# Patient Record
Sex: Female | Born: 1980 | Race: Black or African American | Hispanic: No | Marital: Married | Smoking: Never smoker
Health system: Southern US, Community
[De-identification: ages and names within clinical notes are randomized; demographics above are authoritative.]

## PROBLEM LIST (undated history)

## (undated) DIAGNOSIS — N912 Amenorrhea, unspecified: Secondary | ICD-10-CM

## (undated) DIAGNOSIS — B9689 Other specified bacterial agents as the cause of diseases classified elsewhere: Secondary | ICD-10-CM

## (undated) DIAGNOSIS — E119 Type 2 diabetes mellitus without complications: Secondary | ICD-10-CM

## (undated) DIAGNOSIS — IMO0002 Reserved for concepts with insufficient information to code with codable children: Secondary | ICD-10-CM

## (undated) DIAGNOSIS — R7309 Other abnormal glucose: Secondary | ICD-10-CM

## (undated) DIAGNOSIS — N760 Acute vaginitis: Secondary | ICD-10-CM

## (undated) DIAGNOSIS — D069 Carcinoma in situ of cervix, unspecified: Secondary | ICD-10-CM

## (undated) DIAGNOSIS — N898 Other specified noninflammatory disorders of vagina: Secondary | ICD-10-CM

## (undated) HISTORY — DX: Acute vaginitis: N76.0

## (undated) HISTORY — DX: Type 2 diabetes mellitus without complications: E11.9

## (undated) HISTORY — DX: Other abnormal glucose: R73.09

## (undated) HISTORY — DX: Reserved for concepts with insufficient information to code with codable children: IMO0002

## (undated) HISTORY — DX: Other specified noninflammatory disorders of vagina: N89.8

## (undated) HISTORY — DX: Amenorrhea, unspecified: N91.2

## (undated) HISTORY — DX: Carcinoma in situ of cervix, unspecified: D06.9

## (undated) HISTORY — DX: Other specified bacterial agents as the cause of diseases classified elsewhere: B96.89

---

## 2007-04-04 ENCOUNTER — Emergency Department (HOSPITAL_COMMUNITY): Admission: EM | Admit: 2007-04-04 | Discharge: 2007-04-04 | Payer: Self-pay | Admitting: Emergency Medicine

## 2009-07-07 ENCOUNTER — Emergency Department: Payer: Self-pay | Admitting: Emergency Medicine

## 2010-04-05 ENCOUNTER — Emergency Department: Payer: Self-pay | Admitting: Emergency Medicine

## 2012-05-31 ENCOUNTER — Encounter: Payer: Self-pay | Admitting: Obstetrics and Gynecology

## 2012-09-28 ENCOUNTER — Observation Stay: Payer: Self-pay | Admitting: Obstetrics and Gynecology

## 2012-10-09 ENCOUNTER — Inpatient Hospital Stay: Payer: Self-pay | Admitting: Obstetrics and Gynecology

## 2012-10-09 LAB — CBC WITH DIFFERENTIAL/PLATELET
Basophil %: 0.3 %
Eosinophil %: 0.3 %
HCT: 33.5 % — ABNORMAL LOW (ref 35.0–47.0)
Lymphocyte #: 1.7 10*3/uL (ref 1.0–3.6)
MCH: 24.9 pg — ABNORMAL LOW (ref 26.0–34.0)
MCHC: 31.3 g/dL — ABNORMAL LOW (ref 32.0–36.0)
MCV: 80 fL (ref 80–100)
Monocyte #: 0.8 x10 3/mm (ref 0.2–0.9)
Monocyte %: 6.2 %
Neutrophil %: 79.3 %
Platelet: 189 10*3/uL (ref 150–440)
RDW: 14.8 % — ABNORMAL HIGH (ref 11.5–14.5)
WBC: 12.3 10*3/uL — ABNORMAL HIGH (ref 3.6–11.0)

## 2012-10-10 LAB — HEMATOCRIT: HCT: 29.2 % — ABNORMAL LOW (ref 35.0–47.0)

## 2012-12-19 HISTORY — PX: CERVICAL BIOPSY  W/ LOOP ELECTRODE EXCISION: SUR135

## 2013-10-09 ENCOUNTER — Ambulatory Visit: Payer: Self-pay | Admitting: Family Medicine

## 2013-11-12 ENCOUNTER — Ambulatory Visit: Payer: Self-pay | Admitting: Family Medicine

## 2013-11-12 DIAGNOSIS — Z0289 Encounter for other administrative examinations: Secondary | ICD-10-CM

## 2014-11-04 LAB — HM PAP SMEAR: HM PAP: NEGATIVE

## 2015-04-28 NOTE — H&P (Signed)
L&D Evaluation:  History:   HPI 31 yof G3P2002 aqt 39.[redacted] weeks gestation.    Presents with contractions    Patient's Medical History GBS+; ASB; L.Uteri    Patient's Surgical History none    Medications Pre Natal Vitamins    Allergies NKDA    Social History none    Family History Non-Contributory   ROS:   ROS All systems were reviewed.  HEENT, CNS, GI, GU, Respiratory, CV, Renal and Musculoskeletal systems were found to be normal.   Exam:   Vital Signs stable    Urine Protein negative dipstick    General no apparent distress    Heart normal sinus rhythm    Abdomen gravid, non-tender    Estimated Fetal Weight Average for gestational age, 8#    Edema no edema    Pelvic no external lesions, 5/80/-2/VTX/BOWI    Mebranes Intact    FHT normal rate with no decels    Ucx regular    Skin dry, no lesions, no rashes    Lymph no lymphadenopathy    Other A+/Atb-/NR/RI/VI/HB-/HIV-/Glucola 151/3hrGTT WNL/ GBS+   Impression:   Impression active labor, GBS+   Plan:   Plan antibiotics for GBBS prophylaxis, SVD   Electronic Signatures: Erika Salazar, Prentice DockerMartin A (MD)  (Signed 22-Oct-13 19:30)  Authored: L&D Evaluation   Last Updated: 22-Oct-13 19:30 by Iridiana Fonner, Prentice DockerMartin A (MD)

## 2015-06-18 ENCOUNTER — Encounter: Payer: Self-pay | Admitting: Obstetrics and Gynecology

## 2015-11-05 DIAGNOSIS — R7303 Prediabetes: Secondary | ICD-10-CM | POA: Insufficient documentation

## 2015-12-22 ENCOUNTER — Encounter: Payer: Self-pay | Admitting: Obstetrics and Gynecology

## 2016-03-24 ENCOUNTER — Ambulatory Visit (INDEPENDENT_AMBULATORY_CARE_PROVIDER_SITE_OTHER): Payer: Managed Care, Other (non HMO) | Admitting: Obstetrics and Gynecology

## 2016-03-24 ENCOUNTER — Encounter: Payer: Self-pay | Admitting: Obstetrics and Gynecology

## 2016-03-24 VITALS — BP 110/73 | HR 62 | Wt 153.1 lb

## 2016-03-24 DIAGNOSIS — Z87410 Personal history of cervical dysplasia: Secondary | ICD-10-CM | POA: Diagnosis not present

## 2016-03-24 DIAGNOSIS — Z8742 Personal history of other diseases of the female genital tract: Secondary | ICD-10-CM

## 2016-03-24 DIAGNOSIS — Z3041 Encounter for surveillance of contraceptive pills: Secondary | ICD-10-CM | POA: Diagnosis not present

## 2016-03-24 DIAGNOSIS — Z0000 Encounter for general adult medical examination without abnormal findings: Secondary | ICD-10-CM | POA: Diagnosis not present

## 2016-03-24 DIAGNOSIS — Z01419 Encounter for gynecological examination (general) (routine) without abnormal findings: Secondary | ICD-10-CM

## 2016-03-24 MED ORDER — NORETHIN ACE-ETH ESTRAD-FE 1-20 MG-MCG(24) PO TABS: 1.0000 | ORAL_TABLET | Freq: Every day | ORAL | Status: DC

## 2016-03-24 NOTE — Patient Instructions (Signed)
1. Pap smear is done today. 2. Continue with self breast exam monthly basis 3. Continue with healthy eating and exercise. Exercise 30 minutes a day 5 days a week. 4. Minimize alcohol 1 drink a day or less; avoid tobacco products 5. Return in 1 year for annual exam 6. Birth control pills are refilled for 1 year.

## 2016-03-24 NOTE — Progress Notes (Signed)
ANNUAL PREVENTATIVE CARE GYN  ENCOUNTER NOTE  Subjective:       Erika Salazar is a 35 y.o. No obstetric history on file. female here for a routine annual gynecologic exam.  Current complaints: 1.  Several episodes of pelvic pressure while running feeling like "uterus was falling out"  On June L1/20 oral contraceptives with regular cycles. Bowel and bladder function are normal. While returning to regular activity, patient noted they pelvic pressure symptoms temporarily; symptoms have since resolved.  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 Contraception: OCP (estrogen/progesterone) History of cervical dysplasia, status post LEEP cone biopsy  Obstetric History OB History  No data available    Past Medical History  Diagnosis Date  . Severe dysplasia of cervix   . BV (bacterial vaginosis)   . ASCUS with positive high risk HPV   . Amenorrhea   . Vaginal cyst   . Elevated glucose     Past Surgical History  Procedure Laterality Date  . Cervical biopsy  w/ loop electrode excision  2014    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- POST LEEP ECC- 05/2014 NEG    Current Outpatient Prescriptions on File Prior to Visit  Medication Sig Dispense Refill  . Norethindrone Acet-Ethinyl Est (JUNEL 1/20 PO) Take by mouth.     No current facility-administered medications on file prior to visit.    No Known Allergies  Social History   Social History  . Marital Status: Married    Spouse Name: N/A  . Number of Children: N/A  . Years of Education: N/A   Occupational History  . Not on file.   Social History Main Topics  . Smoking status: Never Smoker   . Smokeless tobacco: Not on file  . Alcohol Use: No  . Drug Use: No  . Sexual Activity: Yes    Birth Control/ Protection: Pill   Other Topics Concern  . Not on file   Social History Narrative    Family  History  Problem Relation Age of Onset  . Diabetes Father   . Cancer Neg Hx   . Heart disease Neg Hx     The following portions of the patient's history were reviewed and updated as appropriate: allergies, current medications, past family history, past medical history, past social history, past surgical history and problem list.  Review of Systems ROS Review of Systems - General ROS: negative for - chills, fatigue, fever, hot flashes, night sweats, weight gain or weight loss Psychological ROS: negative for - anxiety, decreased libido, depression, mood swings, physical abuse or sexual abuse Ophthalmic ROS: negative for - blurry vision, eye pain or loss of vision ENT ROS: negative for - headaches, hearing change, visual changes or vocal changes Allergy and Immunology ROS: negative for - hives, itchy/watery eyes or seasonal allergies Hematological and Lymphatic ROS: negative for - bleeding problems, bruising, swollen lymph nodes or weight loss Endocrine ROS: negative for - galactorrhea, hair pattern changes, hot flashes, malaise/lethargy, mood swings, palpitations, polydipsia/polyuria, skin changes, temperature intolerance or unexpected weight changes Breast ROS: negative for - new or changing breast lumps or nipple discharge Respiratory ROS: negative for - cough or shortness of breath Cardiovascular ROS: negative for - chest pain, irregular heartbeat, palpitations or shortness of breath Gastrointestinal ROS: no abdominal pain, change in bowel habits, or black or bloody stools Genito-Urinary ROS: no dysuria, trouble voiding, or hematuria Musculoskeletal ROS: negative for -  joint pain or joint stiffness Neurological ROS: negative for - bowel and bladder control changes Dermatological ROS: negative for rash and skin lesion changes   Objective:   BP 110/73 mmHg  Pulse 62  Wt 153 lb 1 oz (69.429 kg)  LMP 03/03/2016 (Exact Date) CONSTITUTIONAL: Well-developed, well-nourished female in no  acute distress.  PSYCHIATRIC: Normal mood and affect. Normal behavior. Normal judgment and thought content. NEUROLGIC: Alert and oriented to person, place, and time. Normal muscle tone coordination. No cranial nerve deficit noted. HENT:  Normocephalic, atraumatic, External right and left ear normal. Oropharynx is clear and moist EYES: Conjunctivae and EOM are normal. No scleral icterus.  NECK: Normal range of motion, supple, no masses.  Normal thyroid.  SKIN: Skin is warm and dry. No rash noted. Not diaphoretic. No erythema. No pallor. CARDIOVASCULAR: Normal heart rate noted, regular rhythm, no murmur. RESPIRATORY: Clear to auscultation bilaterally. Effort and breath sounds normal, no problems with respiration noted. BREASTS: Symmetric in size. No masses, skin changes, nipple drainage, or lymphadenopathy. ABDOMEN: Soft, normal bowel sounds, no distention noted.  No tenderness, rebound or guarding.  BLADDER: Normal PELVIC:  External Genitalia: Normal  BUS: Normal  Vagina: Normal; good vault support  Cervix: Normal; no cervical motion tenderness  Uterus: Normal; midplane, normal size and shape, mobile, nontender; no evidence of defective support  Adnexa: Normal  RV: External Exam NormaI  MUSCULOSKELETAL: Normal range of motion. No tenderness.  No cyanosis, clubbing, or edema.  2+ distal pulses. LYMPHATIC: No Axillary, Supraclavicular, or Inguinal Adenopathy.    Assessment:   Annual gynecologic examination 35 y.o. Contraception: OCP (estrogen/progesterone)  Junel 1/20 refilled  Normal BMI Problem List Items Addressed This Visit    None    Visit Diagnoses    Hx of abnormal cervical Pap smear    -  Primary    Relevant Orders    Pap IG and HPV (high risk) DNA detection    Well woman exam        Relevant Orders    Pap IG and HPV (high risk) DNA detection    Encounter for surveillance of contraceptive pills           Plan:  Pap: Pap Co Test Mammogram: Not Indicated Stool Guaiac  Testing:  Not Indicated Labs: Per Dr. Burnadette Pop Routine preventative health maintenance measures emphasized: Exercise/Diet/Weight control, Tobacco Warnings, Alcohol/Substance use risks and Safe Sex Return to Clinic - 1 Year   Herold Harms, MD    Note: This dictation was prepared with Dragon dictation along with smaller phrase technology. Any transcriptional errors that result from this process are unintentional.

## 2016-03-30 LAB — PAP IG AND HPV HIGH-RISK
HPV, high-risk: NEGATIVE
PAP Smear Comment: 0

## 2016-11-03 ENCOUNTER — Telehealth: Payer: Self-pay | Admitting: Obstetrics and Gynecology

## 2016-11-03 NOTE — Telephone Encounter (Signed)
Patient is requesting to have her hormone levels checked. She states she has been experiencing perimenopausal symptoms. Thanks

## 2016-11-04 NOTE — Telephone Encounter (Signed)
Pt has had irregular cycles x 2 months and nite sweats.  D/c ocp in 06/2016. She is having cycles q 28- 35 days. Lasting 3-5 days. First couple of days heavy- changing q 2h. Did see pcp yesterday. PCP ordered tsh, cmp, and cbc. Advised if all labs are normal to call back and make an appt to see mad.

## 2017-05-11 ENCOUNTER — Encounter: Payer: Self-pay | Admitting: Obstetrics and Gynecology

## 2017-05-11 ENCOUNTER — Ambulatory Visit (INDEPENDENT_AMBULATORY_CARE_PROVIDER_SITE_OTHER): Payer: Managed Care, Other (non HMO) | Admitting: Obstetrics and Gynecology

## 2017-05-11 VITALS — BP 105/71 | HR 75 | Ht 66.0 in | Wt 161.6 lb

## 2017-05-11 DIAGNOSIS — Z01419 Encounter for gynecological examination (general) (routine) without abnormal findings: Secondary | ICD-10-CM | POA: Diagnosis not present

## 2017-05-11 DIAGNOSIS — Z8742 Personal history of other diseases of the female genital tract: Secondary | ICD-10-CM

## 2017-05-11 DIAGNOSIS — Z87410 Personal history of cervical dysplasia: Secondary | ICD-10-CM

## 2017-05-11 DIAGNOSIS — Z87898 Personal history of other specified conditions: Secondary | ICD-10-CM

## 2017-05-11 DIAGNOSIS — Z3041 Encounter for surveillance of contraceptive pills: Secondary | ICD-10-CM | POA: Diagnosis not present

## 2017-05-11 NOTE — Patient Instructions (Signed)
1. No Pap smear done 2. Self breast awareness encouraged 3. Continue with healthy eating and exercise 4. Condoms-contraception 5. Return for tubal ligation preoperative 6. Return in 1 year for annual exam  Health Maintenance, Female Adopting a healthy lifestyle and getting preventive care can go a long way to promote health and wellness. Talk with your health care provider about what schedule of regular examinations is right for you. This is a good chance for you to check in with your provider about disease prevention and staying healthy. In between checkups, there are plenty of things you can do on your own. Experts have done a lot of research about which lifestyle changes and preventive measures are most likely to keep you healthy. Ask your health care provider for more information. Weight and diet Eat a healthy diet  Be sure to include plenty of vegetables, fruits, low-fat dairy products, and lean protein.  Do not eat a lot of foods high in solid fats, added sugars, or salt.  Get regular exercise. This is one of the most important things you can do for your health.  Most adults should exercise for at least 150 minutes each week. The exercise should increase your heart rate and make you sweat (moderate-intensity exercise).  Most adults should also do strengthening exercises at least twice a week. This is in addition to the moderate-intensity exercise. Maintain a healthy weight  Body mass index (BMI) is a measurement that can be used to identify possible weight problems. It estimates body fat based on height and weight. Your health care provider can help determine your BMI and help you achieve or maintain a healthy weight.  For females 65 years of age and older:  A BMI below 18.5 is considered underweight.  A BMI of 18.5 to 24.9 is normal.  A BMI of 25 to 29.9 is considered overweight.  A BMI of 30 and above is considered obese. Watch levels of cholesterol and blood lipids  You  should start having your blood tested for lipids and cholesterol at 38 years of age, then have this test every 5 years.  You may need to have your cholesterol levels checked more often if:  Your lipid or cholesterol levels are high.  You are older than 38 years of age.  You are at high risk for heart disease. Cancer screening Lung Cancer  Lung cancer screening is recommended for adults 59-79 years old who are at high risk for lung cancer because of a history of smoking.  A yearly low-dose CT scan of the lungs is recommended for people who:  Currently smoke.  Have quit within the past 15 years.  Have at least a 30-pack-year history of smoking. A pack year is smoking an average of one pack of cigarettes a day for 1 year.  Yearly screening should continue until it has been 15 years since you quit.  Yearly screening should stop if you develop a health problem that would prevent you from having lung cancer treatment. Breast Cancer  Practice breast self-awareness. This means understanding how your breasts normally appear and feel.  It also means doing regular breast self-exams. Let your health care provider know about any changes, no matter how small.  If you are in your 20s or 30s, you should have a clinical breast exam (CBE) by a health care provider every 1-3 years as part of a regular health exam.  If you are 46 or older, have a CBE every year. Also consider having a breast  X-ray (mammogram) every year.  If you have a family history of breast cancer, talk to your health care provider about genetic screening.  If you are at high risk for breast cancer, talk to your health care provider about having an MRI and a mammogram every year.  Breast cancer gene (BRCA) assessment is recommended for women who have family members with BRCA-related cancers. BRCA-related cancers include:  Breast.  Ovarian.  Tubal.  Peritoneal cancers.  Results of the assessment will determine the need  for genetic counseling and BRCA1 and BRCA2 testing. Cervical Cancer  Your health care provider may recommend that you be screened regularly for cancer of the pelvic organs (ovaries, uterus, and vagina). This screening involves a pelvic examination, including checking for microscopic changes to the surface of your cervix (Pap test). You may be encouraged to have this screening done every 3 years, beginning at age 72.  For women ages 56-65, health care providers may recommend pelvic exams and Pap testing every 3 years, or they may recommend the Pap and pelvic exam, combined with testing for human papilloma virus (HPV), every 5 years. Some types of HPV increase your risk of cervical cancer. Testing for HPV may also be done on women of any age with unclear Pap test results.  Other health care providers may not recommend any screening for nonpregnant women who are considered low risk for pelvic cancer and who do not have symptoms. Ask your health care provider if a screening pelvic exam is right for you.  If you have had past treatment for cervical cancer or a condition that could lead to cancer, you need Pap tests and screening for cancer for at least 20 years after your treatment. If Pap tests have been discontinued, your risk factors (such as having a new sexual partner) need to be reassessed to determine if screening should resume. Some women have medical problems that increase the chance of getting cervical cancer. In these cases, your health care provider may recommend more frequent screening and Pap tests. Colorectal Cancer  This type of cancer can be detected and often prevented.  Routine colorectal cancer screening usually begins at 38 years of age and continues through 38 years of age.  Your health care provider may recommend screening at an earlier age if you have risk factors for colon cancer.  Your health care provider may also recommend using home test kits to check for hidden blood in the  stool.  A small camera at the end of a tube can be used to examine your colon directly (sigmoidoscopy or colonoscopy). This is done to check for the earliest forms of colorectal cancer.  Routine screening usually begins at age 17.  Direct examination of the colon should be repeated every 5-10 years through 38 years of age. However, you may need to be screened more often if early forms of precancerous polyps or small growths are found. Skin Cancer  Check your skin from head to toe regularly.  Tell your health care provider about any new moles or changes in moles, especially if there is a change in a mole's shape or color.  Also tell your health care provider if you have a mole that is larger than the size of a pencil eraser.  Always use sunscreen. Apply sunscreen liberally and repeatedly throughout the day.  Protect yourself by wearing long sleeves, pants, a wide-brimmed hat, and sunglasses whenever you are outside. Heart disease, diabetes, and high blood pressure  High blood pressure causes heart  disease and increases the risk of stroke. High blood pressure is more likely to develop in:  People who have blood pressure in the high end of the normal range (130-139/85-89 mm Hg).  People who are overweight or obese.  People who are African American.  If you are 33-24 years of age, have your blood pressure checked every 3-5 years. If you are 61 years of age or older, have your blood pressure checked every year. You should have your blood pressure measured twice-once when you are at a hospital or clinic, and once when you are not at a hospital or clinic. Record the average of the two measurements. To check your blood pressure when you are not at a hospital or clinic, you can use:  An automated blood pressure machine at a pharmacy.  A home blood pressure monitor.  If you are between 66 years and 34 years old, ask your health care provider if you should take aspirin to prevent  strokes.  Have regular diabetes screenings. This involves taking a blood sample to check your fasting blood sugar level.  If you are at a normal weight and have a low risk for diabetes, have this test once every three years after 38 years of age.  If you are overweight and have a high risk for diabetes, consider being tested at a younger age or more often. Preventing infection Hepatitis B  If you have a higher risk for hepatitis B, you should be screened for this virus. You are considered at high risk for hepatitis B if:  You were born in a country where hepatitis B is common. Ask your health care provider which countries are considered high risk.  Your parents were born in a high-risk country, and you have not been immunized against hepatitis B (hepatitis B vaccine).  You have HIV or AIDS.  You use needles to inject street drugs.  You live with someone who has hepatitis B.  You have had sex with someone who has hepatitis B.  You get hemodialysis treatment.  You take certain medicines for conditions, including cancer, organ transplantation, and autoimmune conditions. Hepatitis C  Blood testing is recommended for:  Everyone born from [birth year] through 1967.  Anyone with known risk factors for hepatitis C. Sexually transmitted infections (STIs)  You should be screened for sexually transmitted infections (STIs) including gonorrhea and chlamydia if:  You are sexually active and are younger than 38 years of age.  You are older than 38 years of age and your health care provider tells you that you are at risk for this type of infection.  Your sexual activity has changed since you were last screened and you are at an increased risk for chlamydia or gonorrhea. Ask your health care provider if you are at risk.  If you do not have HIV, but are at risk, it may be recommended that you take a prescription medicine daily to prevent HIV infection. This is called pre-exposure prophylaxis  (PrEP). You are considered at risk if:  You are sexually active and do not regularly use condoms or know the HIV status of your partner(s).  You take drugs by injection.  You are sexually active with a partner who has HIV. Talk with your health care provider about whether you are at high risk of being infected with HIV. If you choose to begin PrEP, you should first be tested for HIV. You should then be tested every 3 months for as long as you are taking PrEP.  Pregnancy  If you are premenopausal and you may become pregnant, ask your health care provider about preconception counseling.  If you may become pregnant, take 400 to 800 micrograms (mcg) of folic acid every day.  If you want to prevent pregnancy, talk to your health care provider about birth control (contraception). Osteoporosis and menopause  Osteoporosis is a disease in which the bones lose minerals and strength with aging. This can result in serious bone fractures. Your risk for osteoporosis can be identified using a bone density scan.  If you are 20 years of age or older, or if you are at risk for osteoporosis and fractures, ask your health care provider if you should be screened.  Ask your health care provider whether you should take a calcium or vitamin D supplement to lower your risk for osteoporosis.  Menopause may have certain physical symptoms and risks.  Hormone replacement therapy may reduce some of these symptoms and risks. Talk to your health care provider about whether hormone replacement therapy is right for you. Follow these instructions at home:  Schedule regular health, dental, and eye exams.  Stay current with your immunizations.  Do not use any tobacco products including cigarettes, chewing tobacco, or electronic cigarettes.  If you are pregnant, do not drink alcohol.  If you are breastfeeding, limit how much and how often you drink alcohol.  Limit alcohol intake to no more than 1 drink per day for  nonpregnant women. One drink equals 12 ounces of beer, 5 ounces of wine, or 1 ounces of hard liquor.  Do not use street drugs.  Do not share needles.  Ask your health care provider for help if you need support or information about quitting drugs.  Tell your health care provider if you often feel depressed.  Tell your health care provider if you have ever been abused or do not feel safe at home. This information is not intended to replace advice given to you by your health care provider. Make sure you discuss any questions you have with your health care provider. Document Released: 06/20/2011 Document Revised: 05/12/2016 Document Reviewed: 09/08/2015 Elsevier Interactive Patient Education  2017 [employer].

## 2017-05-11 NOTE — Progress Notes (Signed)
ANNUAL PREVENTATIVE CARE GYN  ENCOUNTER NOTE  Subjective:       Erika Salazar is a 36 y.o. G34P3003 female here for a routine annual gynecologic exam.  Current complaints: 1.  Discuss BTL   Patient desires permanent sterilization. Cycles are regular on a monthly basis Duration of flow is 5 days first 3 days being heavy No history of dysmenorrhea No history of dyspareunia History of cervical dysplasia, status post LEEP cone biopsy with positive margin; subsequent post LEEP ECC was negative; last Pap/HPV was negative negative in 2017   Gynecologic History Patient's last menstrual period was 04/23/2017 (exact date). Contraception: none Last Pap: 03/2016 neg/neg Results were: normal Last mammogram: n/a. Results were: [name]  Obstetric History OB History  Gravida Para Term Preterm AB Living  3 3 3     3   SAB TAB Ectopic Multiple Live Births          3    # Outcome Date GA Lbr Len/2nd Weight Sex Delivery Anes PTL Lv  3 Term 2013   7 lb 8 oz (3.402 kg) M Vag-Spont   LIV  2 Term 2003   5 lb 9.6 oz (2.54 kg) M Vag-Spont   LIV  1 Term 1999   7 lb 9.6 oz (3.447 kg) F Vag-Spont   LIV      Past Medical History:  Diagnosis Date  . Amenorrhea   . ASCUS with positive high risk HPV   . BV (bacterial vaginosis)   . Elevated glucose   . Severe dysplasia of cervix   . Vaginal cyst     Past Surgical History:  Procedure Laterality Date  . CERVICAL BIOPSY  W/ LOOP ELECTRODE EXCISION  2014   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- POST LEEP ECC- 05/2014 NEG    No current outpatient prescriptions on file prior to visit.   No current facility-administered medications on file prior to visit.     No Known Allergies  Social History   Social History  . Marital status: Married    Spouse name: N/A  . Number of children: N/A  . Years of education: N/A   Occupational History  . Not on file.   Social History Main Topics  . Smoking status: Never Smoker  . Smokeless tobacco: Not on file  . Alcohol  use No  . Drug use: No  . Sexual activity: Yes    Birth control/ protection: Pill   Other Topics Concern  . Not on file   Social History Narrative  . No narrative on file    Family History  Problem Relation Age of Onset  . Diabetes Father   . Cancer Neg Hx   . Heart disease Neg Hx     The following portions of the patient's history were reviewed and updated as appropriate: allergies, current medications, past family history, past medical history, past social history, past surgical history and problem list.  Review of Systems Review of Systems  Constitutional: Negative.   Eyes: Negative.   Respiratory: Negative.   Cardiovascular: Negative.   Gastrointestinal: Negative.   Genitourinary: Negative.        Menses heavy for 3 days, uses tampons and pads simultaneously No dysmenorrhea No dyspareunia  Musculoskeletal: Negative.   Skin: Negative.   Neurological: Negative.   Endo/Heme/Allergies: Negative.   Psychiatric/Behavioral: Negative.     Objective:   BP 105/71   Pulse 75   Ht 5\' 6"  (1.676 m)   Wt 161 lb 9.6 oz (73.3 kg)  LMP 04/23/2017 (Exact Date)   BMI 26.08 kg/m  CONSTITUTIONAL: Well-developed, well-nourished female in no acute distress.  PSYCHIATRIC: Normal mood and affect. Normal behavior. Normal judgment and thought content. NEUROLGIC: Alert and oriented to person, place, and time. Normal muscle tone coordination. No cranial nerve deficit noted. HENT:  Normocephalic, atraumatic, External right and left ear normal. Oropharynx is clear and moist EYES: Conjunctivae and EOM are normal.No scleral icterus.  NECK: Normal range of motion, supple, no masses.  Normal thyroid.  SKIN: Skin is warm and dry. No rash noted. Not diaphoretic. No erythema. No pallor. CARDIOVASCULAR: Normal heart rate noted, regular rhythm, no murmur. RESPIRATORY: Clear to auscultation bilaterally. Effort and breath sounds normal, no problems with respiration noted. BREASTS: Symmetric in  size. No masses, skin changes, nipple drainage, or lymphadenopathy. ABDOMEN: Soft, normal bowel sounds, no distention noted.  No tenderness, rebound or guarding.  BLADDER: Normal PELVIC:  External Genitalia: Normal  BUS: Normal  Vagina: Normal  Cervix: Normal; no cervical motion tenderness  Uterus: Normal; midplane, normal size and shape, nontender, mobile  Adnexa: Normal; nonpalpable and nontender  RV: External Exam NormaI  MUSCULOSKELETAL: Normal range of motion. No tenderness.  No cyanosis, clubbing, or edema.  2+ distal pulses. LYMPHATIC: No Axillary, Supraclavicular, or Inguinal Adenopathy.    Assessment:   Annual gynecologic examination 36 y.o. Contraception: none [id number] Problem List Items Addressed This Visit    History of cervical dysplasia    Other Visit Diagnoses    Well woman exam    -  Primary   Encounter for surveillance of contraceptive pills       Hx of abnormal cervical Pap smear        Desires permanent sterilization  Plan:  Pap: due 2020 Mammogram: Not Indicated Stool Guaiac Testing:  Not Indicated Labs: thru pcp Routine preventative health maintenance measures emphasized: Exercise/Diet/Weight control, Tobacco Warnings, Alcohol/Substance use risks and Safe Sex  Scheduled for laparoscopic bilateral tubal banding Return for preoperative appointment week before surgery Return to Clinic - 1 Year   Crystal Folly BeachMiller, CMA  Herold HarmsMartin A Defrancesco, MD  Note: This dictation was prepared with Dragon dictation along with smaller phrase technology. Any transcriptional errors that result from this process are unintentional.

## 2017-07-11 ENCOUNTER — Encounter: Payer: Managed Care, Other (non HMO) | Admitting: Obstetrics and Gynecology

## 2017-11-06 DIAGNOSIS — E559 Vitamin D deficiency, unspecified: Secondary | ICD-10-CM | POA: Insufficient documentation

## 2017-11-06 DIAGNOSIS — Z7185 Encounter for immunization safety counseling: Secondary | ICD-10-CM | POA: Insufficient documentation

## 2017-11-06 DIAGNOSIS — E538 Deficiency of other specified B group vitamins: Secondary | ICD-10-CM | POA: Insufficient documentation

## 2017-11-06 DIAGNOSIS — Z862 Personal history of diseases of the blood and blood-forming organs and certain disorders involving the immune mechanism: Secondary | ICD-10-CM | POA: Insufficient documentation

## 2017-11-06 DIAGNOSIS — Z7189 Other specified counseling: Secondary | ICD-10-CM | POA: Insufficient documentation

## 2017-11-07 ENCOUNTER — Other Ambulatory Visit: Payer: Self-pay

## 2017-11-20 NOTE — Progress Notes (Signed)
11/21/2017 3:15 PM   Erika Salazar December 24, 1980 [id number]  Referring provider: Dion Body, MD [facility] [place], [place] [address]  Chief Complaint  Patient presents with  . Hematuria    HPI: Patient is a 36 -year-old [place] American female who presents today as a referral from Dr. Fulton Mole for UTI and microscopic hematuria.    Patient was found to have microscopic hematuria on 11/12/2017 with 4-10 RBC's/hpf.  Urine culture was positive for mixed urogenital flora.  Patient doesn't have a prior history of microscopic hematuria.    She does have a prior history of recurrent urinary tract infections with 3 UTI's this year and last year.  Reviewing her records, she has had two documented positive urine cultures and one documented positive culture this year.  She also had an episode of pyelonephrosis.  She does not have a history of nephrolithiasis, trauma to the genitourinary tract or malignancies of the genitourinary tract.  She does not have a family medical history of nephrolithiasis, malignancies of the genitourinary tract or hematuria.    Today, she is having symptoms of dysuria and nocturia.  Her UA today is negative.  She is not experiencing any suprapubic pain, abdominal pain or flank pain. She denies any recent fevers, chills, nausea or vomiting.   She has not had any recent imaging studies.   She is not a smoker.    She is not exposed to secondhand smoke.  She has not worked with Sports administrator, trichloroethylene, etc.    She is sexually active but has not noted a correlation with UTI's and sexual activity.   She has good perineal hygiene.  She does not take tub baths.  She does not have constipation.  She is not drinking a lot of water.    PMH: Past Medical History:  Diagnosis Date  . Amenorrhea   . ASCUS with positive high risk HPV   . BV (bacterial vaginosis)   . Elevated glucose   . Severe dysplasia of cervix   . Vaginal cyst      Surgical History: Past Surgical History:  Procedure Laterality Date  . CERVICAL BIOPSY  W/ LOOP ELECTRODE EXCISION  2014   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- POST LEEP ECC- 05/2014 NEG    Home Medications:  Allergies as of 11/21/2017   No Known Allergies     Medication List        Accurate as of 11/21/17  3:15 PM. Always use your most recent med list.          cholecalciferol 400 units Tabs tablet Commonly known as:  VITAMIN D Take 400 Units by mouth daily.   vitamin B-12 100 MCG tablet Commonly known as:  CYANOCOBALAMIN Take 100 mcg by mouth daily.       Allergies: No Known Allergies  Family History: Family History  Problem Relation Age of Onset  . Diabetes Father   . Hypertension Father   . Diabetes Mother   . Hypertension Mother   . Cancer Neg Hx   . Heart disease Neg Hx   . Kidney cancer Neg Hx   . Kidney disease Neg Hx   . Sickle cell trait Neg Hx   . Prostate cancer Neg Hx   . Tuberculosis Neg Hx     Social History:  reports that  has never smoked. she has never used smokeless tobacco. She reports that she drinks alcohol. She reports that she does not use drugs.  ROS: UROLOGY  Frequent Urination?: No Hard to postpone urination?: No Burning/pain with urination?: Yes Get up at night to urinate?: Yes Leakage of urine?: No Urine stream starts and stops?: No Trouble starting stream?: No Do you have to strain to urinate?: No Blood in urine?: No Urinary tract infection?: Yes Sexually transmitted disease?: No Injury to kidneys or bladder?: No Painful intercourse?: No Weak stream?: No Currently pregnant?: No Vaginal bleeding?: No Last menstrual period?: 11/05/17  Gastrointestinal Nausea?: No Vomiting?: No Indigestion/heartburn?: No Diarrhea?: No Constipation?: No  Constitutional Fever: No Night sweats?: No Weight loss?: No Fatigue?: No  Skin Skin rash/lesions?: No Itching?: No  Eyes Blurred vision?: No Double vision?:  No  Ears/Nose/Throat Sore throat?: No Sinus problems?: No  Hematologic/Lymphatic Swollen glands?: No Easy bruising?: No  Cardiovascular Leg swelling?: No Chest pain?: No  Respiratory Cough?: No Shortness of breath?: No  Endocrine Excessive thirst?: No  Musculoskeletal Back pain?: No Joint pain?: No  Neurological Headaches?: No Dizziness?: No  Psychologic Depression?: No Anxiety?: No  Physical Exam: BP 114/73   Pulse 77   Ht '5\' 6"'$  (1.676 m)   Wt 159 lb 6.4 oz (72.3 kg)   LMP 11/05/2017 (Exact Date)   BMI 25.73 kg/m   Constitutional: Well nourished. Alert and oriented, No acute distress. HEENT: [place] AT, moist mucus membranes. Trachea midline, no masses. Cardiovascular: No clubbing, cyanosis, or edema. Respiratory: Normal respiratory effort, no increased work of breathing. GI: Abdomen is soft, non tender, non distended, no abdominal masses. Liver and spleen not palpable.  No hernias appreciated.  Stool sample for occult testing is not indicated.   GU: No CVA tenderness.  No bladder fullness or masses.   Skin: No rashes, bruises or suspicious lesions. Lymph: No cervical or inguinal adenopathy. Neurologic: Grossly intact, no focal deficits, moving all 4 extremities. Psychiatric: Normal mood and affect.  Laboratory Data: Urinalysis Negative.  See Epic.   Assessment & Plan:    1. Microscopic hematuria  - I explained to the patient that there are a number of causes that can be associated with blood in the urine, such as stones, UTI's, damage to the urinary tract and/or cancer.  - At this time, I felt that the patient warranted further urologic evaluation.   The AUA guidelines state that a CT urogram is the preferred imaging study to evaluate hematuria.  - I explained to the patient that a contrast material will be injected into a vein and that in rare instances, an allergic reaction can result and may even life threatening   The patient denies any allergies to  contrast, iodine and/or seafood and is not taking metformin.  - Her reproductive status is unknown at this time.     - Following the imaging study,  I've recommended a cystoscopy. I described how this is performed, typically in an office setting with a flexible cystoscope. We described the risks, benefits, and possible side effects, the most common of which is a minor amount of blood in the urine and/or burning which usually resolves in 24 to 48 hours.    - The patient had the opportunity to ask questions which were answered. Based upon this discussion, the patient is willing to proceed. Therefore, I've ordered: a CT Urogram and cystoscopy.  - The patient will return following all of the above for discussion of the results.   - UA  - Urine culture  - BUN + creatinine     - serum pregnancy test  2. UTI's  - criteria for recurrent  UTI not yet has been met with 2 or more infections in 6 months or 3 or greater infections in one year   - Patient is instructed to increase their water intake until the urine is pale yellow or clear (10 to 12 cups daily) - she admits that she does not drink a lot of water  - She is encouraged to take probiotics (yogurt, oral pills or vaginal suppositories), take cranberry pills or drink the juice and Vitamin C 1,000 mg daily to acidify the urine should be added to their daily regimen   -.if using tampons, she should remove them prior to urinating and change them often   - avoid soaking in tubs and wipe front to back after urinating   - advised them to have CATH UA's for urinalysis and culture to prevent skin contamination of the specimen  - reviewed symptoms of UTI and advised not to have urine checked or be treated for UTI if not experiencing symptoms  - discussed antibiotic stewardship with the patient                                         Return for CT Urogram report and cystoscopy.  These notes generated with voice recognition software. I apologize for  typographical errors.  Zara Council, [facility] [address] [place], [place] [address] [phone]

## 2017-11-21 ENCOUNTER — Encounter: Payer: Self-pay | Admitting: Urology

## 2017-11-21 ENCOUNTER — Ambulatory Visit: Payer: 59 | Admitting: Urology

## 2017-11-21 ENCOUNTER — Telehealth: Payer: Self-pay | Admitting: Urology

## 2017-11-21 VITALS — BP 114/73 | HR 77 | Ht 66.0 in | Wt 159.4 lb

## 2017-11-21 DIAGNOSIS — N3001 Acute cystitis with hematuria: Secondary | ICD-10-CM | POA: Diagnosis not present

## 2017-11-21 DIAGNOSIS — R3129 Other microscopic hematuria: Secondary | ICD-10-CM | POA: Diagnosis not present

## 2017-11-21 LAB — MICROSCOPIC EXAMINATION

## 2017-11-21 LAB — URINALYSIS, COMPLETE
Bilirubin, UA: NEGATIVE
Glucose, UA: NEGATIVE
LEUKOCYTES UA: NEGATIVE
NITRITE UA: NEGATIVE
PH UA: 6 (ref 5.0–7.5)
Protein, UA: NEGATIVE
Specific Gravity, UA: 1.025 (ref 1.005–1.030)
Urobilinogen, Ur: 0.2 mg/dL (ref 0.2–1.0)

## 2017-11-21 NOTE — Telephone Encounter (Signed)
Will you send my note to Dr. Ocie Cornfieldoland Laney?

## 2017-11-21 NOTE — Patient Instructions (Signed)

## 2017-11-22 LAB — BUN+CREAT
BUN/Creatinine Ratio: 15 (ref 9–23)
BUN: 14 mg/dL (ref 6–20)
Creatinine, Ser: 0.92 mg/dL (ref 0.57–1.00)
GFR calc Af Amer: 91 mL/min/{1.73_m2} (ref >59)
GFR, EST NON AFRICAN AMERICAN: 79 mL/min/{1.73_m2} (ref >59)

## 2017-11-22 LAB — HCG, SERUM, QUALITATIVE: HCG, BETA SUBUNIT, QUAL, SERUM: NEGATIVE m[IU]/mL (ref <6)

## 2017-11-22 NOTE — Telephone Encounter (Signed)
I don't see a Ocie Cornfieldoland Laney? Do you know where he is? Dr. Burnadette PopLinthavong referred her?   Marcelino DusterMichelle

## 2017-11-23 LAB — CULTURE, URINE COMPREHENSIVE

## 2017-11-28 NOTE — Telephone Encounter (Signed)
Erika Salazar, Erika Buren Jr., MD  [address]  [place], [place] [id number]  [phone]  [phone] (Fax)

## 2017-11-29 ENCOUNTER — Telehealth: Payer: Self-pay | Admitting: Urology

## 2017-11-29 NOTE — Telephone Encounter (Signed)
Thanks, They have been faxed  Erika DusterMichelle

## 2017-12-15 ENCOUNTER — Ambulatory Visit: Admission: RE | Admit: 2017-12-15 | Payer: 59 | Source: Ambulatory Visit

## 2017-12-15 ENCOUNTER — Telehealth: Payer: Self-pay | Admitting: Urology

## 2017-12-15 NOTE — Telephone Encounter (Signed)
Patient called to let us know that she cx her CT and cysto She stated that her CT scan would have cost her $4000 out of pocket and she could not afford that.  Erika DusterMichelle

## 2017-12-27 ENCOUNTER — Other Ambulatory Visit: Payer: 59 | Admitting: Urology

## 2018-01-31 NOTE — Telephone Encounter (Signed)
Would you reach out to Erika Salazar and see if willing to reschedule her CTU and cystoscopy?  It is a new year and her insurance situation may have changed.

## 2018-02-01 ENCOUNTER — Telehealth: Payer: Self-pay | Admitting: Urology

## 2018-02-01 NOTE — Telephone Encounter (Signed)
Nothing has changed, her deductible is the same    [place] East[place]le

## 2018-04-23 ENCOUNTER — Encounter: Payer: Self-pay | Admitting: Obstetrics and Gynecology

## 2018-05-18 NOTE — Progress Notes (Signed)
ANNUAL PREVENTATIVE CARE GYN  ENCOUNTER NOTE  Subjective:       Erika Salazar is a 37 y.o. G45P3003 female here for a routine annual gynecologic exam.  Current complaints: 1.  none  No major health issues.  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  Cycles are regular on a monthly basis. Duration of flow is 5 days first 3 days being heavy Positive history for dysmenorrhea No history of dyspareunia History of cervical dysplasia, status post LEEP cone biopsy with positive margin; subsequent post LEEP ECC was negative; last Pap/HPV was negative negative in 2017 She does want to begin birth control pills to help with cycle regulation and for contraception.   Gynecologic History LMP-05/08/2018 Contraception: none Last Pap: 03/2016 neg/neg Results were: normal Last mammogram: n/a.  Obstetric History OB History  Gravida Para Term Preterm AB Living  3 3 3     3   SAB TAB Ectopic Multiple Live Births          3    # Outcome Date GA Lbr Len/2nd Weight Sex Delivery Anes PTL Lv  3 Term 2013   7 lb 8 oz (3.402 kg) M Vag-Spont   LIV  2 Term 2003   5 lb 9.6 oz (2.54 kg) M Vag-Spont   LIV  1 Term 1999   7 lb 9.6 oz (3.447 kg) F Vag-Spont   LIV    Past Medical History:  Diagnosis Date  . Amenorrhea   . ASCUS with positive high risk HPV   . BV (bacterial vaginosis)   . Elevated glucose   . Severe dysplasia of cervix   . Vaginal cyst     Past Surgical History:  Procedure Laterality Date  . CERVICAL BIOPSY  W/ LOOP ELECTRODE EXCISION  2014   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- POST LEEP ECC- 05/2014 NEG    Current Outpatient Medications on File Prior to Visit  Medication Sig Dispense Refill  . cholecalciferol (VITAMIN D) 400 units TABS tablet Take 400 Units by mouth daily.    . vitamin B-12 (CYANOCOBALAMIN) 100 MCG tablet Take 100 mcg by mouth daily.     No current  facility-administered medications on file prior to visit.     No Known Allergies  Social History   Socioeconomic History  . Marital status: Married    Spouse name: Not on file  . Number of children: Not on file  . Years of education: Not on file  . Highest education level: Not on file  Occupational History  . Not on file  Social Needs  . Financial resource strain: Not on file  . Food insecurity:    Worry: Not on file    Inability: Not on file  . Transportation needs:    Medical: Not on file    Non-medical: Not on file  Tobacco Use  . Smoking status: Never Smoker  . Smokeless tobacco: Never Used  Substance and Sexual Activity  . Alcohol use: Yes    Comment: Occasional  . Drug use: No  . Sexual activity: Yes    Birth control/protection: None  Lifestyle  . Physical activity:    Days per week: Not on file    Minutes per session: Not on file  . Stress: Not on file  Relationships  . Social connections:    Talks on phone: Not on file    Gets together: Not on file  Attends religious service: Not on file    Active member of club or organization: Not on file    Attends meetings of clubs or organizations: Not on file    Relationship status: Not on file  . Intimate partner violence:    Fear of current or ex partner: Not on file    Emotionally abused: Not on file    Physically abused: Not on file    Forced sexual activity: Not on file  Other Topics Concern  . Not on file  Social History Narrative  . Not on file    Family History  Problem Relation Age of Onset  . Diabetes Father   . Hypertension Father   . Diabetes Mother   . Hypertension Mother   . Cancer Neg Hx   . Heart disease Neg Hx   . Kidney cancer Neg Hx   . Kidney disease Neg Hx   . Sickle cell trait Neg Hx   . Prostate cancer Neg Hx   . Tuberculosis Neg Hx     The following portions of the patient's history were reviewed and updated as appropriate: allergies, current medications, past family  history, past medical history, past social history, past surgical history and problem list.  Review of Systems Review of Systems  Constitutional: Negative.   HENT: Negative.   Eyes: Negative.   Cardiovascular: Negative.   Gastrointestinal: Negative.   Genitourinary: Negative.   Musculoskeletal: Negative.   Skin: Negative.   Neurological: Negative.   Endo/Heme/Allergies: Negative.   Psychiatric/Behavioral: Negative.     Objective:   BP 105/69   Pulse 76   Ht 5\' 6"  (1.676 m)   Wt 164 lb 3.2 oz (74.5 kg)   LMP 05/08/2018 (Exact Date)   BMI 26.50 kg/m  CONSTITUTIONAL: Well-developed, well-nourished female in no acute distress.  PSYCHIATRIC: Normal mood and affect. Normal behavior. Normal judgment and thought content. NEUROLGIC: Alert and oriented to person, place, and time. Normal muscle tone coordination. No cranial nerve deficit noted. HENT:  Normocephalic, atraumatic, External right and left ear normal.  EYES: Conjunctivae and EOM are normal.No scleral icterus.  NECK: Normal range of motion, supple, no masses.  Normal thyroid.  SKIN: Skin is warm and dry. No rash noted. Not diaphoretic. No erythema. No pallor. CARDIOVASCULAR: Normal heart rate noted, regular rhythm, no murmur. RESPIRATORY: Clear to auscultation bilaterally. Effort and breath sounds normal, no problems with respiration noted. BREASTS: Symmetric in size. No masses, skin changes, nipple drainage, or lymphadenopathy. ABDOMEN: Soft, no distention noted.  No tenderness, rebound or guarding.  BLADDER: Normal PELVIC:  External Genitalia: Normal  BUS: Normal  Vagina: Normal; normal secretions  Cervix: Normal; no cervical motion tenderness; no lesions  Uterus: Normal; midplane, normal size and shape, nontender, mobile  Adnexa: Normal; nonpalpable and nontender  RV: External Exam NormaI  MUSCULOSKELETAL: Normal range of motion. No tenderness.  No cyanosis, clubbing, or edema.  LYMPHATIC: No Axillary,  Supraclavicular, or Inguinal Adenopathy.    Assessment:   Annual gynecologic examination 37 y.o. Contraception: none contraception desired bmi-26 Problem List Items Addressed This Visit    History of cervical dysplasia    Other Visit Diagnoses    Well woman exam    -  Primary   Encounter for surveillance of contraceptive pills       Hx of abnormal cervical Pap smear          Plan:  Pap: due 2020 Mammogram: Not Indicated Stool Guaiac Testing:  Not Indicated Labs: thru pcp Routine preventative  health maintenance measures emphasized: Exercise/Diet/Weight control, Tobacco Warnings, Alcohol/Substance use risks and Safe Sex  Begin Lo Loestrin oral contraceptives  return to Clinic - 1 Year   Crystal Sylvan SpringsMiller, CMA  Herold HarmsMartin A Monaye Blackie, MD   Note: This dictation was prepared with Dragon dictation along with smaller phrase technology. Any transcriptional errors that result from this process are unintentional.

## 2018-05-24 ENCOUNTER — Encounter: Payer: Self-pay | Admitting: Obstetrics and Gynecology

## 2018-05-24 ENCOUNTER — Ambulatory Visit (INDEPENDENT_AMBULATORY_CARE_PROVIDER_SITE_OTHER): Payer: 59 | Admitting: Obstetrics and Gynecology

## 2018-05-24 VITALS — BP 105/69 | HR 76 | Ht 66.0 in | Wt 164.2 lb

## 2018-05-24 DIAGNOSIS — Z01419 Encounter for gynecological examination (general) (routine) without abnormal findings: Secondary | ICD-10-CM

## 2018-05-24 DIAGNOSIS — Z8742 Personal history of other diseases of the female genital tract: Secondary | ICD-10-CM

## 2018-05-24 DIAGNOSIS — Z3041 Encounter for surveillance of contraceptive pills: Secondary | ICD-10-CM | POA: Diagnosis not present

## 2018-05-24 DIAGNOSIS — Z87410 Personal history of cervical dysplasia: Secondary | ICD-10-CM

## 2018-05-24 DIAGNOSIS — Z87898 Personal history of other specified conditions: Secondary | ICD-10-CM | POA: Diagnosis not present

## 2018-05-24 MED ORDER — NORETHIN-ETH ESTRAD-FE BIPHAS 1 MG-10 MCG / 10 MCG PO TABS: 1.0000 | ORAL_TABLET | Freq: Every day | ORAL | 11 refills | Status: DC

## 2018-05-24 NOTE — Patient Instructions (Signed)
1.  Pap smear is not done.  Next Pap smear is due in 2020 2.  Self breast awareness encouraged 3.  Screening labs are to be obtained through primary care 4.  Continue with healthy eating and exercise 5.  Recommend daily vitamin 6.  Return in 1 year for annual exam  Health Maintenance, Female Adopting a healthy lifestyle and getting preventive care can go a long way to promote health and wellness. Talk with your health care provider about what schedule of regular examinations is right for you. This is a good chance for you to check in with your provider about disease prevention and staying healthy. In between checkups, there are plenty of things you can do on your own. Experts have done a lot of research about which lifestyle changes and preventive measures are most likely to keep you healthy. Ask your health care provider for more information. Weight and diet Eat a healthy diet  Be sure to include plenty of vegetables, fruits, low-fat dairy products, and lean protein.  Do not eat a lot of foods high in solid fats, added sugars, or salt.  Get regular exercise. This is one of the most important things you can do for your health. ? Most adults should exercise for at least 150 minutes each week. The exercise should increase your heart rate and make you sweat (moderate-intensity exercise). ? Most adults should also do strengthening exercises at least twice a week. This is in addition to the moderate-intensity exercise.  Maintain a healthy weight  Body mass index (BMI) is a measurement that can be used to identify possible weight problems. It estimates body fat based on height and weight. Your health care provider can help determine your BMI and help you achieve or maintain a healthy weight.  For females 33 years of age and older: ? A BMI below 18.5 is considered underweight. ? A BMI of 18.5 to 24.9 is normal. ? A BMI of 25 to 29.9 is considered overweight. ? A BMI of 30 and above is  considered obese.  Watch levels of cholesterol and blood lipids  You should start having your blood tested for lipids and cholesterol at 39 years of age, then have this test every 5 years.  You may need to have your cholesterol levels checked more often if: ? Your lipid or cholesterol levels are high. ? You are older than 39 years of age. ? You are at high risk for heart disease.  Cancer screening Lung Cancer  Lung cancer screening is recommended for adults 63-33 years old who are at high risk for lung cancer because of a history of smoking.  A yearly low-dose CT scan of the lungs is recommended for people who: ? Currently smoke. ? Have quit within the past 15 years. ? Have at least a 30-pack-year history of smoking. A pack year is smoking an average of one pack of cigarettes a day for 1 year.  Yearly screening should continue until it has been 15 years since you quit.  Yearly screening should stop if you develop a health problem that would prevent you from having lung cancer treatment.  Breast Cancer  Practice breast self-awareness. This means understanding how your breasts normally appear and feel.  It also means doing regular breast self-exams. Let your health care provider know about any changes, no matter how small.  If you are in your 20s or 30s, you should have a clinical breast exam (CBE) by a health care provider every 1-3  years as part of a regular health exam.  If you are 27 or older, have a CBE every year. Also consider having a breast X-ray (mammogram) every year.  If you have a family history of breast cancer, talk to your health care provider about genetic screening.  If you are at high risk for breast cancer, talk to your health care provider about having an MRI and a mammogram every year.  Breast cancer gene (BRCA) assessment is recommended for women who have family members with BRCA-related cancers. BRCA-related cancers  include: ? Breast. ? Ovarian. ? Tubal. ? Peritoneal cancers.  Results of the assessment will determine the need for genetic counseling and BRCA1 and BRCA2 testing.  Cervical Cancer Your health care provider may recommend that you be screened regularly for cancer of the pelvic organs (ovaries, uterus, and vagina). This screening involves a pelvic examination, including checking for microscopic changes to the surface of your cervix (Pap test). You may be encouraged to have this screening done every 3 years, beginning at age 57.  For women ages 42-65, health care providers may recommend pelvic exams and Pap testing every 3 years, or they may recommend the Pap and pelvic exam, combined with testing for human papilloma virus (HPV), every 5 years. Some types of HPV increase your risk of cervical cancer. Testing for HPV may also be done on women of any age with unclear Pap test results.  Other health care providers may not recommend any screening for nonpregnant women who are considered low risk for pelvic cancer and who do not have symptoms. Ask your health care provider if a screening pelvic exam is right for you.  If you have had past treatment for cervical cancer or a condition that could lead to cancer, you need Pap tests and screening for cancer for at least 20 years after your treatment. If Pap tests have been discontinued, your risk factors (such as having a new sexual partner) need to be reassessed to determine if screening should resume. Some women have medical problems that increase the chance of getting cervical cancer. In these cases, your health care provider may recommend more frequent screening and Pap tests.  Colorectal Cancer  This type of cancer can be detected and often prevented.  Routine colorectal cancer screening usually begins at 39 years of age and continues through 39 years of age.  Your health care provider may recommend screening at an earlier age if you have risk factors  for colon cancer.  Your health care provider may also recommend using home test kits to check for hidden blood in the stool.  A small camera at the end of a tube can be used to examine your colon directly (sigmoidoscopy or colonoscopy). This is done to check for the earliest forms of colorectal cancer.  Routine screening usually begins at age 81.  Direct examination of the colon should be repeated every 5-10 years through 39 years of age. However, you may need to be screened more often if early forms of precancerous polyps or small growths are found.  Skin Cancer  Check your skin from head to toe regularly.  Tell your health care provider about any new moles or changes in moles, especially if there is a change in a mole's shape or color.  Also tell your health care provider if you have a mole that is larger than the size of a pencil eraser.  Always use sunscreen. Apply sunscreen liberally and repeatedly throughout the day.  Protect yourself  by wearing long sleeves, pants, a wide-brimmed hat, and sunglasses whenever you are outside.  Heart disease, diabetes, and high blood pressure  High blood pressure causes heart disease and increases the risk of stroke. High blood pressure is more likely to develop in: ? People who have blood pressure in the high end of the normal range (130-139/85-89 mm Hg). ? People who are overweight or obese. ? People who are African American.  If you are 18-39 years of age, have your blood pressure checked every 3-5 years. If you are 40 years of age or older, have your blood pressure checked every year. You should have your blood pressure measured twice-once when you are at a hospital or clinic, and once when you are not at a hospital or clinic. Record the average of the two measurements. To check your blood pressure when you are not at a hospital or clinic, you can use: ? An automated blood pressure machine at a pharmacy. ? A home blood pressure monitor.  If  you are between 55 years and 79 years old, ask your health care provider if you should take aspirin to prevent strokes.  Have regular diabetes screenings. This involves taking a blood sample to check your fasting blood sugar level. ? If you are at a normal weight and have a low risk for diabetes, have this test once every three years after 39 years of age. ? If you are overweight and have a high risk for diabetes, consider being tested at a younger age or more often. Preventing infection Hepatitis B  If you have a higher risk for hepatitis B, you should be screened for this virus. You are considered at high risk for hepatitis B if: ? You were born in a country where hepatitis B is common. Ask your health care provider which countries are considered high risk. ? Your parents were born in a high-risk country, and you have not been immunized against hepatitis B (hepatitis B vaccine). ? You have HIV or AIDS. ? You use needles to inject street drugs. ? You live with someone who has hepatitis B. ? You have had sex with someone who has hepatitis B. ? You get hemodialysis treatment. ? You take certain medicines for conditions, including cancer, organ transplantation, and autoimmune conditions.  Hepatitis C  Blood testing is recommended for: ? Everyone born from 1945 through 1965. ? Anyone with known risk factors for hepatitis C.  Sexually transmitted infections (STIs)  You should be screened for sexually transmitted infections (STIs) including gonorrhea and chlamydia if: ? You are sexually active and are younger than 39 years of age. ? You are older than 39 years of age and your health care provider tells you that you are at risk for this type of infection. ? Your sexual activity has changed since you were last screened and you are at an increased risk for chlamydia or gonorrhea. Ask your health care provider if you are at risk.  If you do not have HIV, but are at risk, it may be recommended  that you take a prescription medicine daily to prevent HIV infection. This is called pre-exposure prophylaxis (PrEP). You are considered at risk if: ? You are sexually active and do not regularly use condoms or know the HIV status of your partner(s). ? You take drugs by injection. ? You are sexually active with a partner who has HIV.  Talk with your health care provider about whether you are at high risk of being infected   with HIV. If you choose to begin PrEP, you should first be tested for HIV. You should then be tested every 3 months for as long as you are taking PrEP. Pregnancy  If you are premenopausal and you may become pregnant, ask your health care provider about preconception counseling.  If you may become pregnant, take 400 to 800 micrograms (mcg) of folic acid every day.  If you want to prevent pregnancy, talk to your health care provider about birth control (contraception). Osteoporosis and menopause  Osteoporosis is a disease in which the bones lose minerals and strength with aging. This can result in serious bone fractures. Your risk for osteoporosis can be identified using a bone density scan.  If you are 33 years of age or older, or if you are at risk for osteoporosis and fractures, ask your health care provider if you should be screened.  Ask your health care provider whether you should take a calcium or vitamin D supplement to lower your risk for osteoporosis.  Menopause may have certain physical symptoms and risks.  Hormone replacement therapy may reduce some of these symptoms and risks. Talk to your health care provider about whether hormone replacement therapy is right for you. Follow these instructions at home:  Schedule regular health, dental, and eye exams.  Stay current with your immunizations.  Do not use any tobacco products including cigarettes, chewing tobacco, or electronic cigarettes.  If you are pregnant, do not drink alcohol.  If you are  breastfeeding, limit how much and how often you drink alcohol.  Limit alcohol intake to no more than 1 drink per day for nonpregnant women. One drink equals 12 ounces of beer, 5 ounces of wine, or 1 ounces of hard liquor.  Do not use street drugs.  Do not share needles.  Ask your health care provider for help if you need support or information about quitting drugs.  Tell your health care provider if you often feel depressed.  Tell your health care provider if you have ever been abused or do not feel safe at home. This information is not intended to replace advice given to you by your health care provider. Make sure you discuss any questions you have with your health care provider. Document Released: 06/20/2011 Document Revised: 05/12/2016 Document Reviewed: 09/08/2015 Elsevier Interactive Patient Education  [employer].

## 2018-05-24 NOTE — Addendum Note (Signed)
Addended by: Marchelle FolksMILLER, Vilma Will G on: 05/24/2018 10:42 AM   Modules accepted: Orders

## 2018-08-26 ENCOUNTER — Telehealth: Payer: Self-pay | Admitting: Urology

## 2018-08-26 NOTE — Telephone Encounter (Signed)
Erika Salazar did not have her CTU due to financial concerns.  There have been discussions about changing the guidelines from a CT urogram to a renal ultrasound for further evaluation for blood in the urine.  Would a renal ultrasound be less costly for her and which she be willing to undergo a renal ultrasound at this time?

## 2018-08-27 NOTE — Telephone Encounter (Signed)
Left pt mess to call 

## 2018-10-02 ENCOUNTER — Encounter: Payer: Self-pay | Admitting: Urology

## 2018-10-02 NOTE — Progress Notes (Signed)
Certified letter sent on 10/03/2018

## 2018-12-31 ENCOUNTER — Telehealth: Payer: Self-pay | Admitting: Obstetrics and Gynecology

## 2018-12-31 NOTE — Telephone Encounter (Signed)
Spoke with patient and let her know that I have not received PA. She has ask the pharmacy to refax.

## 2018-12-31 NOTE — Telephone Encounter (Signed)
The patient called and stated that she needs to make sure a nurse/provider received a prior authorization form for her birth control from her pharmacy. The patient stated that she is hoping to get a call back today if possible. Pt aware of 24 hr call back policy. Please advise.

## 2019-01-02 NOTE — Telephone Encounter (Signed)
We have not received PA. I have called the pharmacy to ask them to resend again.

## 2019-02-13 ENCOUNTER — Telehealth: Payer: Self-pay | Admitting: Obstetrics and Gynecology

## 2019-02-13 NOTE — Telephone Encounter (Signed)
The patient called and stated that she has been told that her prescription has to go through prior authorization for 3 months now. The patient is a little frustrated due to her having to come to the office to get sample packs of birth control until the script issue is resolved. The patients birth control script is for Norethindrone-Ethinyl Estradiol-Fe Biphas (LO LOESTRIN FE) 1 MG-10 MCG / 10 MCG tablet [[id number]], and also the patient only has a few days left remaining in her current sample pack. The patient is requesting a call back as soon as possible for a solution and confirmation. Please advise.

## 2019-02-14 MED ORDER — NORETHIN ACE-ETH ESTRAD-FE 1-20 MG-MCG(24) PO TABS: 1.0000 | ORAL_TABLET | Freq: Every day | ORAL | 6 refills | Status: DC

## 2019-02-14 NOTE — Addendum Note (Signed)
Addended by: Silvano Bilis on: 02/14/2019 12:25 PM   Modules accepted: Orders

## 2019-02-14 NOTE — Telephone Encounter (Signed)
Pt called and speak with her concerning the issues she was having with her birth control. Pt stated that she was all right with tring another birth control pill to see if that one will be covered.

## 2019-02-14 NOTE — Telephone Encounter (Signed)
Ok.  We can send in Loestrin 20 mcg pills.  Do 90 day supply

## 2019-02-14 NOTE — Telephone Encounter (Signed)
Pt called no answer LM to call the office to inform her of the information I had got from the pharmacy and to discuss changing her birth control to a generic brand.

## 2019-02-14 NOTE — Telephone Encounter (Signed)
Called the pharmacy to see why the pt is not receiving her birth control. Was informed that the pt has a new insurance. Pharmacy is faxing over a form to be completed for the pt.

## 2019-04-11 DIAGNOSIS — B373 Candidiasis of vulva and vagina: Secondary | ICD-10-CM | POA: Diagnosis not present

## 2019-04-11 DIAGNOSIS — N390 Urinary tract infection, site not specified: Secondary | ICD-10-CM | POA: Diagnosis not present

## 2019-04-11 DIAGNOSIS — R300 Dysuria: Secondary | ICD-10-CM | POA: Diagnosis not present

## 2019-04-15 DIAGNOSIS — N390 Urinary tract infection, site not specified: Secondary | ICD-10-CM | POA: Diagnosis not present

## 2019-04-16 DIAGNOSIS — L700 Acne vulgaris: Secondary | ICD-10-CM | POA: Diagnosis not present

## 2019-04-16 DIAGNOSIS — Z0189 Encounter for other specified special examinations: Secondary | ICD-10-CM | POA: Diagnosis not present

## 2019-04-29 DIAGNOSIS — R399 Unspecified symptoms and signs involving the genitourinary system: Secondary | ICD-10-CM | POA: Diagnosis not present

## 2019-05-05 NOTE — Progress Notes (Signed)
05/06/2019 2:07 PM   Erika Salazar Feb 05, 1981 [id number]  Referring provider: Dion Body, MD [place] [facility] [employer], [place] [address]  Chief Complaint  Patient presents with  . Hematuria    HPI: Patient is a 38 -year-old [place] American female who presents today as a referral from Dr. Fulton Mole and Gay Filler PA for hematuria.    I had seen her initially on 11/21/2017 for microscopic hematuria on 11/12/2017 with 4-10 RBC's/hpf.  Urine culture was positive for mixed urogenital flora.  It was recommended at that time to undergo a hematuria work up, but she did not follow through as she could not afford the deductible for the CTU.    She does have a documented prior history of recurrent urinary tract infections.  She also had an episode of pyelonephrosis.    She does not have a history of nephrolithiasis, trauma to the genitourinary tract or malignancies of the genitourinary tract.  She does not have a family medical history of nephrolithiasis, malignancies of the genitourinary tract or hematuria.    She has not had any recent imaging studies.   She is not a smoker.    She is not exposed to secondhand smoke.  She has not worked with Sports administrator, trichloroethylene, etc.    She is sexually active and has noted a correlation with UTI's and sexual activity.   She has good perineal hygiene.  She does take tub baths.  She does not have constipation.  She is not drinking a lot of water.    She continues to have microscopic hematuria that may or may not be associated with UTI's as her urine was not always cultured.    At this time, she is not having any urinary symptoms.  Patient denies any gross hematuria, dysuria or suprapubic/flank pain.  Patient denies any fevers, chills, nausea or vomiting.   Her UA dip today is trace-lysed blood with 0-2 RBC's, 0-5 WBC's and hyaline casts.    PMH: Past Medical History:  Diagnosis Date  . Amenorrhea   .  ASCUS with positive high risk HPV   . BV (bacterial vaginosis)   . Elevated glucose   . Severe dysplasia of cervix   . Vaginal cyst     Surgical History: Past Surgical History:  Procedure Laterality Date  . 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 If you have any questions, ask your nurse or doctor.        cholecalciferol 10 MCG (400 UNIT) Tabs tablet Commonly known as:  VITAMIN D3 Take 400 Units by mouth daily.   diphenhydrAMINE 50 MG tablet Commonly known as:  BENADRYL Take one hour prior to CT Started by:  Aprill Banko, PA-C   Doxycycline Hyclate 200 MG Tbec Take 0.5 tablets by mouth 2 (two) times daily.   Norethindrone Acetate-Ethinyl Estrad-FE 1-20 MG-MCG(24) tablet Commonly known as:  LOESTRIN 24 FE Take 1 tablet by mouth daily.   predniSONE 50 MG tablet Commonly known as:  DELTASONE Take the one tablet 13 hours, 7 hours and 1 hours prior to the CT Urogram Started by:  Justiss Gerbino, PA-C   vitamin B-12 100 MCG tablet Commonly known as:  CYANOCOBALAMIN Take  100 mcg by mouth daily.       Allergies: No Known Allergies  Family History: Family History  Problem Relation Age of Onset  . Diabetes Father   . Hypertension Father   . Diabetes Mother   . Hypertension Mother   . Cancer Neg Hx   . Heart disease Neg Hx   . Kidney cancer Neg Hx   . Kidney disease Neg Hx   . Sickle cell trait Neg Hx   . Prostate cancer Neg Hx   . Tuberculosis Neg Hx     Social History:  reports that she has never smoked. She has never used smokeless tobacco. She reports current alcohol use. She reports that she does not use drugs.  ROS: UROLOGY Frequent Urination?: No Hard to postpone urination?: No Burning/pain with urination?: No Get up at night to  urinate?: No Leakage of urine?: No Urine stream starts and stops?: No Trouble starting stream?: No Do you have to strain to urinate?: No Blood in urine?: No Urinary tract infection?: No Sexually transmitted disease?: No Injury to kidneys or bladder?: No Painful intercourse?: No Weak stream?: No Currently pregnant?: No Vaginal bleeding?: No Last menstrual period?: n  Gastrointestinal Nausea?: No Vomiting?: No Indigestion/heartburn?: No Diarrhea?: No Constipation?: No  Constitutional Fever: No Night sweats?: No Weight loss?: No Fatigue?: No  Skin Skin rash/lesions?: No Itching?: No  Eyes Blurred vision?: No Double vision?: No  Ears/Nose/Throat Sore throat?: No Sinus problems?: No  Hematologic/Lymphatic Swollen glands?: No Easy bruising?: No  Cardiovascular Leg swelling?: No Chest pain?: No  Respiratory Cough?: No Shortness of breath?: No  Endocrine Excessive thirst?: No  Musculoskeletal Back pain?: No Joint pain?: No  Neurological Headaches?: No Dizziness?: No  Psychologic Depression?: No Anxiety?: No  Physical Exam: BP 110/68   Pulse 71   Ht '5\' 6"'$  (1.676 m)   Wt 156 lb (70.8 kg)   BMI 25.18 kg/m   Constitutional:  Well nourished. Alert and oriented, No acute distress. HEENT: [place] AT, moist mucus membranes.  Trachea midline, no masses. Cardiovascular: No clubbing, cyanosis, or edema. Respiratory: Normal respiratory effort, no increased work of breathing. Neurologic: Grossly intact, no focal deficits, moving all 4 extremities. Psychiatric: Normal mood and affect.  Laboratory Data: Urinalysis See HPI and Epic.  I have reviewed the labs.  Assessment & Plan:    1. Microscopic hematuria I reviewed with Erika Salazar the conditions that are associated with blood in the urine, such as stones, UTI's, damage to the urinary tract and/or cancer. At this time, she would like to pursue a formal hematuria work up I explained to the patient that a  contrast material will be injected into a vein and that in rare instances, an allergic reaction can result and may even life threatening   She has not been exposed to contrast material in the past and is not allergic to iodine, she did admit to have stomach pains with shrimp therefore I will send in an allergy prep of prednisone and Benadryl and have instructed her on how and when to take it Her reproductive status is unknown at this time.  We will obtain a serum pregnancy test.  Following the imaging study,  I've recommended a cystoscopy. I described how this is performed, typically in an office setting with a flexible cystoscope. We described the risks, benefits, and possible side effects, the most common of which is a minor amount of blood in the urine and/or burning which usually resolves in 24 to 48 hours.  The patient had the opportunity to ask questions which were answered. Based upon this discussion, the patient is willing to proceed. Therefore, I've ordered: a CT Urogram and cystoscopy. The patient will return following all of the above for discussion of the results.  UA Urine culture BUN + creatinine    serum pregnancy test  2. UTI's Criteria for recurrent UTI has been met with 2 or more infections in 6 months or 3 or greater infections in one year  If no etiology is found for her rUTI's during her hematuria work up, will follow up to discuss dietary and behavioral changes to help reduce the frequency of her infections                                       Return for CT Urogram report and cystoscopy.  These notes generated with voice recognition software. I apologize for typographical errors.  Erika Council, PA-C  [facility] [address] [place], [place] [address] [phone]

## 2019-05-06 ENCOUNTER — Ambulatory Visit: Payer: 59 | Admitting: Urology

## 2019-05-06 ENCOUNTER — Other Ambulatory Visit: Payer: Self-pay

## 2019-05-06 ENCOUNTER — Encounter: Payer: Self-pay | Admitting: Urology

## 2019-05-06 VITALS — BP 110/68 | HR 71 | Ht 66.0 in | Wt 156.0 lb

## 2019-05-06 DIAGNOSIS — R3129 Other microscopic hematuria: Secondary | ICD-10-CM

## 2019-05-06 DIAGNOSIS — N390 Urinary tract infection, site not specified: Secondary | ICD-10-CM | POA: Diagnosis not present

## 2019-05-06 DIAGNOSIS — R310 Gross hematuria: Secondary | ICD-10-CM | POA: Diagnosis not present

## 2019-05-06 LAB — URINALYSIS, COMPLETE
Bilirubin, UA: NEGATIVE
Glucose, UA: NEGATIVE
Leukocytes,UA: NEGATIVE
Nitrite, UA: NEGATIVE
Specific Gravity, UA: 1.025 (ref 1.005–1.030)
Urobilinogen, Ur: 0.2 mg/dL (ref 0.2–1.0)
pH, UA: 5 (ref 5.0–7.5)

## 2019-05-06 LAB — MICROSCOPIC EXAMINATION

## 2019-05-06 MED ORDER — DIPHENHYDRAMINE HCL 50 MG PO TABS: ORAL_TABLET | ORAL | 0 refills | Status: DC

## 2019-05-06 MED ORDER — PREDNISONE 50 MG PO TABS: ORAL_TABLET | ORAL | 0 refills | Status: DC

## 2019-05-06 NOTE — Patient Instructions (Signed)
Hematuria, Adult Hematuria is blood in the urine. Blood may be visible in the urine, or it may be identified with a test. This condition can be caused by infections of the bladder, urethra, kidney, or prostate. Other possible causes include:  Kidney stones.  Cancer of the urinary tract.  Too much calcium in the urine.  Conditions that are passed from parent to child (inherited conditions).  Exercise that requires a lot of energy. Infections can usually be treated with medicine, and a kidney stone usually will pass through your urine. If neither of these is the cause of your hematuria, more tests may be needed to identify the cause of your symptoms. It is very important to tell your health care provider about any blood in your urine, even if it is painless or the blood stops without treatment. Blood in the urine, when it happens and then stops and then happens again, can be a symptom of a very serious condition, including cancer. There is no pain in the initial stages of many urinary cancers. Follow these instructions at home: Medicines  Take over-the-counter and prescription medicines only as told by your health care provider.  If you were prescribed an antibiotic medicine, take it as told by your health care provider. Do not stop taking the antibiotic even if you start to feel better. Eating and drinking  Drink enough fluid to keep your urine clear or pale yellow. It is recommended that you drink 3-4 quarts (2.8-3.8 L) a day. If you have been diagnosed with an infection, it is recommended that you drink cranberry juice in addition to large amounts of water.  Avoid caffeine, tea, and carbonated beverages. These tend to irritate the bladder.  Avoid alcohol because it may irritate the prostate (men). General instructions  If you have been diagnosed with a kidney stone, follow your health care provider's instructions about straining your urine to catch the stone.  Empty your bladder  often. Avoid holding urine for long periods of time.  If you are female: ? After a bowel movement, wipe from front to back and use each piece of toilet paper only once. ? Empty your bladder before and after sex.  Pay attention to any changes in your symptoms. Tell your health care provider about any changes or any new symptoms.  It is your responsibility to get your test results. Ask your health care provider, or the department performing the test, when your results will be ready.  Keep all follow-up visits as told by your health care provider. This is important. Contact a health care provider if:  You develop back pain.  You have a fever.  You have nausea or vomiting.  Your symptoms do not improve after 3 days.  Your symptoms get worse. Get help right away if:  You develop severe vomiting and are unable take medicine without vomiting.  You develop severe pain in your back or abdomen even though you are taking medicine.  You pass a large amount of blood in your urine.  You pass blood clots in your urine.  You feel very weak or like you might faint.  You faint. Summary  Hematuria is blood in the urine. It has many possible causes.  It is very important that you tell your health care provider about any blood in your urine, even if it is painless or the blood stops without treatment.  Take over-the-counter and prescription medicines only as told by your health care provider.  Drink enough fluid to keep   your urine clear or pale yellow. This information is not intended to replace advice given to you by your health care provider. Make sure you discuss any questions you have with your health care provider. Document Released: 12/05/2005 Document Revised: 01/07/2017 Document Reviewed: 01/07/2017 Elsevier Interactive Patient Education  2019 [employer]  CT Scan  A CT scan (computed tomography scan) is an imaging scan. It uses X-rays and a computer to make detailed pictures  of different areas inside the body. A CT scan can give more information than a regular X-ray exam. A CT scan provides data about internal organs, soft tissue structures, blood vessels, and bones. In this procedure, the pictures will be taken in a large machine that has an opening (CT scanner). Tell a health care provider about:  Any allergies you have.  All medicines you are taking, including vitamins, herbs, eye drops, creams, and over-the-counter medicines.  Any blood disorders you have.  Any surgeries you have had.  Any medical conditions you have.  Whether you are pregnant or may be pregnant. What are the risks? Generally, this is a safe procedure. However, problems may occur, including:  An allergic reaction to dyes.  Development of cancer from excessive exposure to radiation from multiple CT scans. This is rare. What happens before the procedure? Staying hydrated Follow instructions from your health care provider about hydration, which may include:  Up to 2 hours before the procedure - you may continue to drink clear liquids, such as water, clear fruit juice, black coffee, and plain tea. Eating and drinking restrictions Follow instructions from your health care provider about eating and drinking, which may include:  24 hours before the procedure - stop drinking caffeinated beverages, such as energy drinks, tea, soda, coffee, and hot chocolate.  8 hours before the procedure - stop eating heavy meals or foods such as meat, fried foods, or fatty foods.  6 hours before the procedure - stop eating light meals or foods, such as toast or cereal.  6 hours before the procedure - stop drinking milk or drinks that contain milk.  2 hours before the procedure - stop drinking clear liquids. General instructions  Remove any jewelry.  Ask your health care provider about changing or stopping your regular medicines. This is especially important if you are taking diabetes medicines or  blood thinners. What happens during the procedure?  You will lie on a table with your arms above your head.  An IV tube may be inserted into one of your veins.  The contrast dye may be injected into the IV tube. You may feel warm or have a metallic taste in your mouth.  The table you will be lying on will move into the CT scanner.  You will be able to see, hear, and talk to the person running the machine while you are in it. Follow that person's instructions.  The CT scanner will move around you to take pictures. Do not move while it is scanning. Staying still helps the scanner to get a good image.  When the best possible pictures have been taken, the machine will be turned off. The table will be moved out of the machine.  The IV tube will be removed. The procedure may vary among health care providers and hospitals. What happens after the procedure?  It is up to you to get the results of your procedure. Ask your health care provider, or the department that is doing the procedure, when your results will be ready. Summary  A CT scan is an imaging scan.  A CT scan uses X-rays and a computer to make detailed pictures of different areas of your body.  Follow instructions from your health care provider about eating and drinking before the procedure.  You will be able to see, hear, and talk to the person running the machine while you are in it. Follow that person's instructions. This information is not intended to replace advice given to you by your health care provider. Make sure you discuss any questions you have with your health care provider. Document Released: 01/12/2005 Document Revised: 01/07/2017 Document Reviewed: 01/07/2017 Elsevier Interactive Patient Education  2019 [employer]

## 2019-05-07 LAB — BUN+CREAT
BUN/Creatinine Ratio: 13 (ref 9–23)
BUN: 12 mg/dL (ref 6–20)
Creatinine, Ser: 0.9 mg/dL (ref 0.57–1.00)
GFR calc Af Amer: 93 mL/min/{1.73_m2} (ref >59)
GFR calc non Af Amer: 81 mL/min/{1.73_m2} (ref >59)

## 2019-05-07 LAB — HCG, SERUM, QUALITATIVE: hCG,Beta Subunit,Qual,Serum: NEGATIVE m[IU]/mL (ref <6)

## 2019-05-09 ENCOUNTER — Other Ambulatory Visit: Payer: Self-pay | Admitting: Urology

## 2019-05-09 DIAGNOSIS — R7303 Prediabetes: Secondary | ICD-10-CM | POA: Diagnosis not present

## 2019-05-09 DIAGNOSIS — E559 Vitamin D deficiency, unspecified: Secondary | ICD-10-CM | POA: Diagnosis not present

## 2019-05-09 DIAGNOSIS — E7800 Pure hypercholesterolemia, unspecified: Secondary | ICD-10-CM | POA: Diagnosis not present

## 2019-05-09 DIAGNOSIS — E538 Deficiency of other specified B group vitamins: Secondary | ICD-10-CM | POA: Diagnosis not present

## 2019-05-09 LAB — CULTURE, URINE COMPREHENSIVE

## 2019-05-09 MED ORDER — NITROFURANTOIN MONOHYD MACRO 100 MG PO CAPS: 100.0000 mg | ORAL_CAPSULE | Freq: Two times a day (BID) | ORAL | 0 refills | Status: DC

## 2019-05-09 NOTE — Progress Notes (Signed)
Macrobid sent to pharmacy.

## 2019-05-16 DIAGNOSIS — Z79899 Other long term (current) drug therapy: Secondary | ICD-10-CM | POA: Diagnosis not present

## 2019-05-16 DIAGNOSIS — L700 Acne vulgaris: Secondary | ICD-10-CM | POA: Diagnosis not present

## 2019-05-17 ENCOUNTER — Ambulatory Visit
Admission: RE | Admit: 2019-05-17 | Discharge: 2019-05-17 | Disposition: A | Discharge status: Home / Self Care | Payer: 59 | Source: Ambulatory Visit | Attending: Urology | Admitting: Urology

## 2019-05-17 ENCOUNTER — Other Ambulatory Visit: Payer: Self-pay

## 2019-05-17 DIAGNOSIS — N390 Urinary tract infection, site not specified: Secondary | ICD-10-CM | POA: Diagnosis not present

## 2019-05-17 MED ORDER — IOHEXOL 300 MG/ML  SOLN
150.0000 mL | Freq: Once | INTRAMUSCULAR | Status: AC | PRN
  Administered 2019-05-17: 150 mL via INTRAVENOUS

## 2019-05-28 ENCOUNTER — Other Ambulatory Visit: Payer: Self-pay

## 2019-05-28 ENCOUNTER — Ambulatory Visit (INDEPENDENT_AMBULATORY_CARE_PROVIDER_SITE_OTHER): Payer: 59 | Admitting: Urology

## 2019-05-28 ENCOUNTER — Encounter: Payer: Self-pay | Admitting: Urology

## 2019-05-28 VITALS — BP 108/68 | HR 74 | Ht 66.0 in | Wt 156.0 lb

## 2019-05-28 DIAGNOSIS — R3129 Other microscopic hematuria: Secondary | ICD-10-CM

## 2019-05-28 LAB — MICROSCOPIC EXAMINATION
Bacteria, UA: NONE SEEN
RBC: NONE SEEN /hpf (ref 0–2)

## 2019-05-28 LAB — URINALYSIS, COMPLETE
Bilirubin, UA: NEGATIVE
Glucose, UA: NEGATIVE
Ketones, UA: NEGATIVE
Leukocytes,UA: NEGATIVE
Nitrite, UA: NEGATIVE
Protein,UA: NEGATIVE
Specific Gravity, UA: 1.025 (ref 1.005–1.030)
Urobilinogen, Ur: 0.2 mg/dL (ref 0.2–1.0)
pH, UA: 5 (ref 5.0–7.5)

## 2019-05-28 MED ORDER — NITROFURANTOIN MACROCRYSTAL 50 MG PO CAPS: ORAL_CAPSULE | ORAL | 1 refills | Status: DC

## 2019-05-28 NOTE — Patient Instructions (Signed)
Hematuria, Adult Hematuria is blood in the urine. Blood may be visible in the urine, or it may be identified with a test. This condition can be caused by infections of the bladder, urethra, kidney, or prostate. Other possible causes include:  Kidney stones.  Cancer of the urinary tract.  Too much calcium in the urine.  Conditions that are passed from parent to child (inherited conditions).  Exercise that requires a lot of energy. Infections can usually be treated with medicine, and a kidney stone usually will pass through your urine. If neither of these is the cause of your hematuria, more tests may be needed to identify the cause of your symptoms. It is very important to tell your health care provider about any blood in your urine, even if it is painless or the blood stops without treatment. Blood in the urine, when it happens and then stops and then happens again, can be a symptom of a very serious condition, including cancer. There is no pain in the initial stages of many urinary cancers. Follow these instructions at home: Medicines  Take over-the-counter and prescription medicines only as told by your health care provider.  If you were prescribed an antibiotic medicine, take it as told by your health care provider. Do not stop taking the antibiotic even if you start to feel better. Eating and drinking  Drink enough fluid to keep your urine clear or pale yellow. It is recommended that you drink 3-4 quarts (2.8-3.8 L) a day. If you have been diagnosed with an infection, it is recommended that you drink cranberry juice in addition to large amounts of water.  Avoid caffeine, tea, and carbonated beverages. These tend to irritate the bladder.  Avoid alcohol because it may irritate the prostate (men). General instructions  If you have been diagnosed with a kidney stone, follow your health care provider's instructions about straining your urine to catch the stone.  Empty your bladder  often. Avoid holding urine for long periods of time.  If you are female: ? After a bowel movement, wipe from front to back and use each piece of toilet paper only once. ? Empty your bladder before and after sex.  Pay attention to any changes in your symptoms. Tell your health care provider about any changes or any new symptoms.  It is your responsibility to get your test results. Ask your health care provider, or the department performing the test, when your results will be ready.  Keep all follow-up visits as told by your health care provider. This is important. Contact a health care provider if:  You develop back pain.  You have a fever.  You have nausea or vomiting.  Your symptoms do not improve after 3 days.  Your symptoms get worse. Get help right away if:  You develop severe vomiting and are unable take medicine without vomiting.  You develop severe pain in your back or abdomen even though you are taking medicine.  You pass a large amount of blood in your urine.  You pass blood clots in your urine.  You feel very weak or like you might faint.  You faint. Summary  Hematuria is blood in the urine. It has many possible causes.  It is very important that you tell your health care provider about any blood in your urine, even if it is painless or the blood stops without treatment.  Take over-the-counter and prescription medicines only as told by your health care provider.  Drink enough fluid to keep   your urine clear or pale yellow. This information is not intended to replace advice given to you by your health care provider. Make sure you discuss any questions you have with your health care provider. Document Released: 12/05/2005 Document Revised: 01/07/2017 Document Reviewed: 01/07/2017 Elsevier Interactive Patient Education  2019 [employer] Inc[employer]    Cystoscopy  Cystoscopy is a procedure that is used to help diagnose and sometimes treat conditions that affect that  lower urinary tract. The lower urinary tract includes the bladder and the tube that drains urine from the bladder out of the body (urethra). Cystoscopy is performed with a thin, tube-shaped instrument with a light and camera at the end (cystoscope). The cystoscope may be hard (rigid) or flexible, depending on the goal of the procedure.The cystoscope is inserted through the urethra, into the bladder. Cystoscopy may be recommended if you have:  Urinary tractinfections that keep coming back (recurring).  Blood in the urine (hematuria).  Loss of bladder control (urinary incontinence) or an overactive bladder.  Unusual cells found in a urine sample.  A blockage in the urethra.  Painful urination.  An abnormality in the bladder found during an intravenous pyelogram (IVP) or CT scan. Cystoscopy may also be done to remove a sample of tissue to be examined under a microscope (biopsy). Tell a health care provider about:  Any allergies you have.  All medicines you are taking, including vitamins, herbs, eye drops, creams, and over-the-counter medicines.  Any problems you or family members have had with anesthetic medicines.  Any blood disorders you have.  Any surgeries you have had.  Any medical conditions you have.  Whether you are pregnant or may be pregnant. What are the risks? Generally, this is a safe procedure. However, problems may occur, including:  Infection.  Bleeding.  Allergic reactions to medicines.  Damage to other structures or organs. What happens before the procedure?  Ask your health care provider about: ? Changing or stopping your regular medicines. This is especially important if you are taking diabetes medicines or blood thinners. ? Taking medicines such as aspirin and ibuprofen. These medicines can thin your blood. Do not take these medicines before your procedure if your health care provider instructs you not to.  Follow instructions from your health care  provider about eating or drinking restrictions.  You may be given antibiotic medicine to help prevent infection.  You may have an exam or testing, such as X-rays of the bladder, urethra, or kidneys.  You may have urine tests to check for signs of infection.  Plan to have someone take you home after the procedure. What happens during the procedure?  To reduce your risk of infection,your health care team will wash or sanitize their hands.  You will be given one or more of the following: ? A medicine to help you relax (sedative). ? A medicine to numb the area (local anesthetic).  The area around the opening of your urethra will be cleaned.  The cystoscope will be passed through your urethra into your bladder.  Germ-free (sterile)fluid will flow through the cystoscope to fill your bladder. The fluid will stretch your bladder so that your surgeon can clearly examine your bladder walls.  The cystoscope will be removed and your bladder will be emptied. The procedure may vary among health care providers and hospitals. What happens after the procedure?  You may have some soreness or pain in your abdomen and urethra. Medicines will be available to help you.  You may have some blood   in your urine.  Do not drive for 24 hours if you received a sedative. This information is not intended to replace advice given to you by your health care provider. Make sure you discuss any questions you have with your health care provider. Document Released: 12/02/2000 Document Revised: 09/15/2017 Document Reviewed: 10/22/2015 Elsevier Interactive Patient Education  2019 [employer] Inc.  

## 2019-05-28 NOTE — Progress Notes (Signed)
   05/28/19  CC:  Chief Complaint  Patient presents with  . Cysto    HPI: 38 year old female seen by [facility] for history of recurrent UTI and microhematuria.  She denies gross hematuria.  CTU performed on 05/17/2019 Showed focal scarring in the midpole of the right kidney otherwise no upper tract abnormalities.  She does have a history of pyelonephritis.  Blood pressure 108/68, pulse 74, height 5\' 6"  (1.676 m), weight 156 lb (70.8 kg). NED. A&Ox3.   No respiratory distress   Abd soft, NT, ND Normal external genitalia with patent urethral meatus  Cystoscopy Procedure Note  Patient identification was confirmed, informed consent was obtained, and patient was prepped using Betadine solution.  Lidocaine jelly was administered per urethral meatus.    Procedure: - Flexible cystoscope introduced, without any difficulty.   - Thorough search of the bladder revealed:    normal urethral meatus    normal urothelium    no stones    no ulcers     no tumors    no urethral polyps    no trabeculation  - Ureteral orifices were normal in position and appearance.  Post-Procedure: - Patient tolerated the procedure well  Assessment/ Plan: No significant abnormalities CTU or cystoscopy.  She does relate a correlation of her UTIs to within 24 hours after intercourse.  We will give a trial of postcoital prophylaxis with nitrofurantoin 50 mg after intercourse.   Return in about 6 months (around 11/27/2019) for Recheck with Larene Beach.  Abbie Sons, MD

## 2019-06-03 ENCOUNTER — Telehealth: Payer: Self-pay

## 2019-06-03 NOTE — Telephone Encounter (Signed)
Pt called for prescreening no answer LM via voicemail to call the office for prescreening.

## 2019-06-03 NOTE — Progress Notes (Signed)
   PT is present today for her annual exam. Pt stated that she is doing well and denies any issues. No problems or concerns.    

## 2019-06-04 ENCOUNTER — Other Ambulatory Visit: Payer: Self-pay

## 2019-06-04 ENCOUNTER — Other Ambulatory Visit (HOSPITAL_COMMUNITY)
Admission: RE | Admit: 2019-06-04 | Discharge: 2019-06-04 | Disposition: A | Discharge status: Home / Self Care | Payer: 59 | Source: Ambulatory Visit | Attending: Obstetrics and Gynecology | Admitting: Obstetrics and Gynecology

## 2019-06-04 ENCOUNTER — Encounter: Payer: Self-pay | Admitting: Obstetrics and Gynecology

## 2019-06-04 ENCOUNTER — Ambulatory Visit (INDEPENDENT_AMBULATORY_CARE_PROVIDER_SITE_OTHER): Payer: 59 | Admitting: Obstetrics and Gynecology

## 2019-06-04 VITALS — BP 99/72 | HR 104 | Ht 66.0 in | Wt 160.4 lb

## 2019-06-04 DIAGNOSIS — Z124 Encounter for screening for malignant neoplasm of cervix: Secondary | ICD-10-CM | POA: Diagnosis not present

## 2019-06-04 DIAGNOSIS — Z01419 Encounter for gynecological examination (general) (routine) without abnormal findings: Secondary | ICD-10-CM | POA: Insufficient documentation

## 2019-06-04 DIAGNOSIS — R61 Generalized hyperhidrosis: Secondary | ICD-10-CM | POA: Diagnosis not present

## 2019-06-04 NOTE — Patient Instructions (Signed)
Preventive Care 40-64 Years, Female Preventive care refers to lifestyle choices and visits with your health care provider that can promote health and wellness. What does preventive care include?   A yearly physical exam. This is also called an annual well check.  Dental exams once or twice a year.  Routine eye exams. Ask your health care provider how often you should have your eyes checked.  Personal lifestyle choices, including: ? Daily care of your teeth and gums. ? Regular physical activity. ? Eating a healthy diet. ? Avoiding tobacco and drug use. ? Limiting alcohol use. ? Practicing safe sex. ? Taking low-dose aspirin daily starting at age 50. ? Taking vitamin and mineral supplements as recommended by your health care provider. What happens during an annual well check? The services and screenings done by your health care provider during your annual well check will depend on your age, overall health, lifestyle risk factors, and family history of disease. Counseling Your health care provider may ask you questions about your:  Alcohol use.  Tobacco use.  Drug use.  Emotional well-being.  Home and relationship well-being.  Sexual activity.  Eating habits.  Work and work environment.  Method of birth control.  Menstrual cycle.  Pregnancy history. Screening You may have the following tests or measurements:  Height, weight, and BMI.  Blood pressure.  Lipid and cholesterol levels. These may be checked every 5 years, or more frequently if you are over 50 years old.  Skin check.  Lung cancer screening. You may have this screening every year starting at age 55 if you have a 30-pack-year history of smoking and currently smoke or have quit within the past 15 years.  Colorectal cancer screening. All adults should have this screening starting at age 50 and continuing until age 75. Your health care provider may recommend screening at age 45. You will have tests every  1-10 years, depending on your results and the type of screening test. People at increased risk should start screening at an earlier age. Screening tests may include: ? Guaiac-based fecal occult blood testing. ? Fecal immunochemical test (FIT). ? Stool DNA test. ? Virtual colonoscopy. ? Sigmoidoscopy. During this test, a flexible tube with a tiny camera (sigmoidoscope) is used to examine your rectum and lower colon. The sigmoidoscope is inserted through your anus into your rectum and lower colon. ? Colonoscopy. During this test, a long, thin, flexible tube with a tiny camera (colonoscope) is used to examine your entire colon and rectum.  Hepatitis C blood test.  Hepatitis B blood test.  Sexually transmitted disease (STD) testing.  Diabetes screening. This is done by checking your blood sugar (glucose) after you have not eaten for a while (fasting). You may have this done every 1-3 years.  Mammogram. This may be done every 1-2 years. Talk to your health care provider about when you should start having regular mammograms. This may depend on whether you have a family history of breast cancer.  BRCA-related cancer screening. This may be done if you have a family history of breast, ovarian, tubal, or peritoneal cancers.  Pelvic exam and Pap test. This may be done every 3 years starting at age 21. Starting at age 30, this may be done every 5 years if you have a Pap test in combination with an HPV test.  Bone density scan. This is done to screen for osteoporosis. You may have this scan if you are at high risk for osteoporosis. Discuss your test results, treatment options,   and if necessary, the need for more tests with your health care provider. Vaccines Your health care provider may recommend certain vaccines, such as:  Influenza vaccine. This is recommended every year.  Tetanus, diphtheria, and acellular pertussis (Tdap, Td) vaccine. You may need a Td booster every 10 years.  Varicella  vaccine. You may need this if you have not been vaccinated.  Zoster vaccine. You may need this after age 37.  Measles, mumps, and rubella (MMR) vaccine. You may need at least one dose of MMR if you were born in 1957 or later. You may also need a second dose.  Pneumococcal 13-valent conjugate (PCV13) vaccine. You may need this if you have certain conditions and were not previously vaccinated.  Pneumococcal polysaccharide (PPSV23) vaccine. You may need one or two doses if you smoke cigarettes or if you have certain conditions.  Meningococcal vaccine. You may need this if you have certain conditions.  Hepatitis A vaccine. You may need this if you have certain conditions or if you travel or work in places where you may be exposed to hepatitis A.  Hepatitis B vaccine. You may need this if you have certain conditions or if you travel or work in places where you may be exposed to hepatitis B.  Haemophilus influenzae type b (Hib) vaccine. You may need this if you have certain conditions. Talk to your health care provider about which screenings and vaccines you need and how often you need them. This information is not intended to replace advice given to you by your health care provider. Make sure you discuss any questions you have with your health care provider. Document Released: 01/01/2016 Document Revised: 01/25/2018 Document Reviewed: 10/06/2015 Elsevier Interactive Patient Education  2019 [place] 18-39 Years, Female Preventive care refers to lifestyle choices and visits with your health care provider that can promote health and wellness. What does preventive care include?   A yearly physical exam. This is also called an annual well check.  Dental exams once or twice a year.  Routine eye exams. Ask your health care provider how often you should have your eyes checked.  Personal lifestyle choices, including: ? Daily care of your teeth and gums. ? Regular physical  activity. ? Eating a healthy diet. ? Avoiding tobacco and drug use. ? Limiting alcohol use. ? Practicing safe sex. ? Taking vitamin and mineral supplements as recommended by your health care provider. What happens during an annual well check? The services and screenings done by your health care provider during your annual well check will depend on your age, overall health, lifestyle risk factors, and family history of disease. Counseling Your health care provider may ask you questions about your:  Alcohol use.  Tobacco use.  Drug use.  Emotional well-being.  Home and relationship well-being.  Sexual activity.  Eating habits.  Work and work Statistician.  Method of birth control.  Menstrual cycle.  Pregnancy history. Screening You may have the following tests or measurements:  Height, weight, and BMI.  Diabetes screening. This is done by checking your blood sugar (glucose) after you have not eaten for a while (fasting).  Blood pressure.  Lipid and cholesterol levels. These may be checked every 5 years starting at age 29.  Skin check.  Hepatitis C blood test.  Hepatitis B blood test.  Sexually transmitted disease (STD) testing.  BRCA-related cancer screening. This may be done if you have a family history of breast, ovarian, tubal, or peritoneal cancers.  Pelvic exam  and Pap test. This may be done every 3 years starting at age 43. Starting at age 66, this may be done every 5 years if you have a Pap test in combination with an HPV test. Discuss your test results, treatment options, and if necessary, the need for more tests with your health care provider. Vaccines Your health care provider may recommend certain vaccines, such as:  Influenza vaccine. This is recommended every year.  Tetanus, diphtheria, and acellular pertussis (Tdap, Td) vaccine. You may need a Td booster every 10 years.  Varicella vaccine. You may need this if you have not been vaccinated.   HPV vaccine. If you are 65 or younger, you may need three doses over 6 months.  Measles, mumps, and rubella (MMR) vaccine. You may need at least one dose of MMR. You may also need a second dose.  Pneumococcal 13-valent conjugate (PCV13) vaccine. You may need this if you have certain conditions and were not previously vaccinated.  Pneumococcal polysaccharide (PPSV23) vaccine. You may need one or two doses if you smoke cigarettes or if you have certain conditions.  Meningococcal vaccine. One dose is recommended if you are age 60-21 years and a first-year college student living in a residence hall, or if you have one of several medical conditions. You may also need additional booster doses.  Hepatitis A vaccine. You may need this if you have certain conditions or if you travel or work in places where you may be exposed to hepatitis A.  Hepatitis B vaccine. You may need this if you have certain conditions or if you travel or work in places where you may be exposed to hepatitis B.  Haemophilus influenzae type b (Hib) vaccine. You may need this if you have certain risk factors. Talk to your health care provider about which screenings and vaccines you need and how often you need them. This information is not intended to replace advice given to you by your health care provider. Make sure you discuss any questions you have with your health care provider. Document Released: 01/31/2002 Document Revised: 07/18/2017 Document Reviewed: 10/06/2015 Elsevier Interactive Patient Education  2019 [place] Breast self-awareness means:  Knowing how your breasts look.  Knowing how your breasts feel.  Checking your breasts every month for changes.  Telling your doctor if you notice a change in your breasts. Breast self-awareness allows you to notice a breast problem early while it is still small. How to do a breast self-exam One way to learn what is normal for your breasts and to  check for changes is to do a breast self-exam. To do a breast self-exam: Look for Changes  1. Take off all the clothes above your waist. 2. Stand in front of a mirror in a room with good lighting. 3. Put your hands on your hips. 4. Push your hands down. 5. Look at your breasts and nipples in the mirror to see if one breast or nipple looks different than the other. Check to see if: ? The shape of one breast is different. ? The size of one breast is different. ? There are wrinkles, dips, and bumps in one breast and not the other. 6. Look at each breast for changes in your skin, such as: ? Redness. ? Scaly areas. 7. Look for changes in your nipples, such as: ? Liquid around the nipples. ? Bleeding. ? Dimpling. ? Redness. ? A change in where the nipples are. Feel for Changes 1. Lie on your back  on the floor. 2. Feel each breast. To do this, follow these steps: ? Pick a breast to feel. ? Put the arm closest to that breast above your head. ? Use your other arm to feel the nipple area of your breast. Feel the area with the pads of your three middle fingers by making small circles with your fingers. For the first circle, press lightly. For the second circle, press harder. For the third circle, press even harder. ? Keep making circles with your fingers at the light, harder, and even harder pressures as you move down your breast. Stop when you feel your ribs. ? Move your fingers a little toward the center of your body. ? Start making circles with your fingers again, this time going up until you reach your collarbone. ? Keep making up and down circles until you reach your armpit. Remember to keep using the three pressures. ? Feel the other breast in the same way. 3. Sit or stand in the shower or tub. 4. With soapy water on your skin, feel each breast the same way you did in step 2, when you were lying on the floor. Write Down What You Find After doing the self-exam, write down:  What is  normal for each breast.  Any changes you find in each breast.  When you last had your period.  How often should I check my breasts? Check your breasts every month. If you are breastfeeding, the best time to check them is after you feed your baby or after you use a breast pump. If you get periods, the best time to check your breasts is 5-7 days after your period is over. When should I see my doctor? See your doctor if you notice:  A change in shape or size of your breasts or nipples.  A change in the skin of your breast or nipples, such as red or scaly skin.  Unusual fluid coming from your nipples.  A lump or thick area that was not there before.  Pain in your breasts.  Anything that concerns you. This information is not intended to replace advice given to you by your health care provider. Make sure you discuss any questions you have with your health care provider. Document Released: 05/23/2008 Document Revised: 05/12/2016 Document Reviewed: 10/25/2015 Elsevier Interactive Patient Education  2019 [employer].

## 2019-06-04 NOTE — Progress Notes (Signed)
GYNECOLOGY ANNUAL PHYSICAL EXAM PROGRESS NOTE  Subjective:    Erika Salazar is a 38 y.o. [id number] female who presents for an annual exam.  The patient is sexually active. The patient wears seatbelts: yes. The patient participates in regular exercise: no. Has the patient ever been transfused or tattooed?: no. The patient reports that there is not domestic violence in her life. 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   The patient has the following complaints today. 1. Patient states that she has been noting night sweats over the past several months. Denies any other symptoms.   Gynecologic History  Menarche age: [age] Patient's last menstrual period was 05/09/2019. Contraception: OCP (estrogen/progesterone) - Lo Loestrin (has been on this for over 2 years) History of STI's: Denies Last Pap: 03/24/2016. Results were: normal.  Reports h/o abnormal pap smears, and LEEP in 2015. PCP: Dr. Marisue IvanKanhka Linthavong [facility]   OB History  Gravida Para Term Preterm AB Living  3 3 3  0 0 3  SAB TAB Ectopic Multiple Live Births  0 0 0 0 3    # Outcome Date GA Lbr Len/2nd Weight Sex Delivery Anes PTL Lv  3 Term 2013   7 lb 8 oz (3.402 kg) M Vag-Spont   LIV  2 Term 2003   5 lb 9.6 oz (2.54 kg) M Vag-Spont   LIV  1 Term 1999   7 lb 9.6 oz (3.447 kg) F Vag-Spont   LIV    Past Medical History:  Diagnosis Date  . Amenorrhea   . ASCUS with positive high risk HPV   . BV (bacterial vaginosis)   . Elevated glucose   . Severe dysplasia of cervix   . Vaginal cyst     Past Surgical History:  Procedure Laterality Date  . 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 Diabetes Father   . Hypertension Father   . Diabetes Mother   . Hypertension Mother   . Cancer Neg Hx   . Heart disease Neg Hx   . Kidney cancer Neg Hx   . Kidney disease  Neg Hx   . Sickle cell trait Neg Hx   . Prostate cancer Neg Hx   . Tuberculosis Neg Hx     Social History   Socioeconomic History  . Marital status: Married    Spouse name: Not on file  . Number of children: Not on file  . Years of education: Not on file  . Highest education level: Not on file  Occupational History  . Not on file  Social Needs  . Financial resource strain: Not on file  . Food insecurity    Worry: Not on file    Inability: Not on file  . Transportation needs    Medical: Not on file    Non-medical: Not on file  Tobacco Use  . Smoking status: Never Smoker  . Smokeless tobacco: Never Used  Substance and Sexual Activity  . Alcohol use: Yes    Comment: Occasional  . Drug use: No  . Sexual activity: Yes    Birth control/protection: None, Pill  Lifestyle  . Physical activity    Days per week: 2 days    Minutes per session: 30 min  . Stress: Not on file  Relationships  . Social connections  Talks on phone: Not on file    Gets together: Not on file    Attends religious service: Not on file    Active member of club or organization: Not on file    Attends meetings of clubs or organizations: Not on file    Relationship status: Not on file  . Intimate partner violence    Fear of current or ex partner: Not on file    Emotionally abused: Not on file    Physically abused: Not on file    Forced sexual activity: Not on file  Other Topics Concern  . Not on file  Social History Narrative  . Not on file    Current Outpatient Medications on File Prior to Visit  Medication Sig Dispense Refill  . cholecalciferol (VITAMIN D) 400 units TABS tablet Take 400 Units by mouth daily.    . ISOtretinoin (ACCUTANE) 20 MG capsule Take 20 mg by mouth daily.    . nitrofurantoin (MACRODANTIN) 50 MG capsule 1 capsule by mouth after intercourse 30 capsule 1  . Norethindrone Acetate-Ethinyl Estrad-FE (LOESTRIN 24 FE) 1-20 MG-MCG(24) tablet Take 1 tablet by mouth daily. 1 Package  6  . vitamin B-12 (CYANOCOBALAMIN) 100 MCG tablet Take 100 mcg by mouth daily.     No current facility-administered medications on file prior to visit.     No Known Allergies    Review of Systems Constitutional: negative for chills, fatigue, fevers.  Positive for night sweats for several months.  Eyes: negative for irritation, redness and visual disturbance Ears, nose, mouth, throat, and face: negative for hearing loss, nasal congestion, snoring and tinnitus Respiratory: negative for asthma, cough, sputum Cardiovascular: negative for chest pain, dyspnea, exertional chest pressure/discomfort, irregular heart beat, palpitations and syncope Gastrointestinal: negative for abdominal pain, change in bowel habits, nausea and vomiting Genitourinary: negative for abnormal menstrual periods, genital lesions, sexual problems and vaginal discharge, dysuria and urinary incontinence Integument/breast: negative for breast lump, breast tenderness and nipple discharge Hematologic/lymphatic: negative for bleeding and easy bruising Musculoskeletal:negative for back pain and muscle weakness Neurological: negative for dizziness, headaches, vertigo and weakness Endocrine: negative for diabetic symptoms including polydipsia, polyuria and skin dryness Allergic/Immunologic: negative for hay fever and urticaria       Objective:  Blood pressure 99/72, pulse (!) 104, height 5\' 6"  (1.676 m), weight 160 lb 6.4 oz (72.8 kg), last menstrual period 05/09/2019. Body mass index is 25.89 kg/m.  General Appearance:    Alert, cooperative, no distress, appears stated age  Head:    Normocephalic, without obvious abnormality, atraumatic  Eyes:    PERRL, conjunctiva/corneas clear, EOM's intact, both eyes  Ears:    Normal external ear canals, both ears  Nose:   Nares normal, septum midline, mucosa normal, no drainage or sinus tenderness  Throat:   Lips, mucosa, and tongue normal; teeth and gums normal  Neck:   Supple,  symmetrical, trachea midline, no adenopathy; thyroid: no enlargement/tenderness/nodules; no carotid bruit or JVD  Back:     Symmetric, no curvature, ROM normal, no CVA tenderness  Lungs:     Clear to auscultation bilaterally, respirations unlabored  Chest Wall:    No tenderness or deformity   Heart:    Regular rate and rhythm, S1 and S2 normal, no murmur, rub or gallop  Breast Exam:    No tenderness, masses, or nipple abnormality  Abdomen:     Soft, non-tender, bowel sounds active all four quadrants, no masses, no organomegaly.    Genitalia:    Pelvic:external  genitalia normal, vagina without lesions, discharge, or tenderness, rectovaginal septum  normal. Cervix normal in appearance, no cervical motion tenderness, no adnexal masses or tenderness.  Uterus normal size, shape, mobile, regular contours, nontender.  Rectal:    Normal external sphincter.  No hemorrhoids appreciated. Internal exam not done.   Extremities:   Extremities normal, atraumatic, no cyanosis or edema  Pulses:   2+ and symmetric all extremities  Skin:   Skin color, texture, turgor normal, no rashes or lesions  Lymph nodes:   Cervical, supraclavicular, and axillary nodes normal  Neurologic:   CNII-XII intact, normal strength, sensation and reflexes throughout   .  Labs:  Lab Results  Component Value Date   WBC 12.3 (H) 10/09/2012   HGB 10.5 (L) 10/09/2012   HCT 29.2 (L) 10/10/2012   MCV 80 10/09/2012   PLT 189 10/09/2012    Lab Results  Component Value Date   CREATININE 0.90 05/06/2019   BUN 12 05/06/2019    Other labs reviewed in Care Everywhere  Assessment:    Healthy female exam.   Night sweats  Cervical cancer screening  Plan:     Blood tests: TSH.  Patient has had other labs done by PCP in October 2019, reviewed in [place].  Breast self exam technique reviewed and patient encouraged to perform self-exam monthly. Contraception: OCP (estrogen/progesterone).  Discussed healthy lifestyle  modifications. Pap smear performed today. Continue routine screening unless otherwise indicated by pap results.  Advised that hot flushes may be temporary, or if not, may need to increase dosing of OCPs as she is currently on lowest dose birth control. No family history of early menopause. Discussed conservative management for now. Is due for a refill on her OCPs in August, if symptoms worsen or fail to improve, can change OCP. TSH ordered today to rule out other causes of night sweats.  Follow up in 1 year, or sooner as needed.   Rubie Maid, MD [facility]

## 2019-06-05 LAB — TSH: TSH: 0.94 u[IU]/mL (ref 0.450–4.500)

## 2019-06-06 LAB — CYTOLOGY - PAP
Adequacy: ABSENT
Diagnosis: NEGATIVE
HPV: NOT DETECTED

## 2019-06-11 DIAGNOSIS — H52222 Regular astigmatism, left eye: Secondary | ICD-10-CM | POA: Diagnosis not present

## 2019-06-11 DIAGNOSIS — H5213 Myopia, bilateral: Secondary | ICD-10-CM | POA: Diagnosis not present

## 2019-06-17 DIAGNOSIS — L700 Acne vulgaris: Secondary | ICD-10-CM | POA: Diagnosis not present

## 2019-06-17 DIAGNOSIS — Z79899 Other long term (current) drug therapy: Secondary | ICD-10-CM | POA: Diagnosis not present

## 2019-07-18 DIAGNOSIS — Z79899 Other long term (current) drug therapy: Secondary | ICD-10-CM | POA: Diagnosis not present

## 2019-07-18 DIAGNOSIS — L700 Acne vulgaris: Secondary | ICD-10-CM | POA: Diagnosis not present

## 2019-08-19 DIAGNOSIS — L700 Acne vulgaris: Secondary | ICD-10-CM | POA: Diagnosis not present

## 2019-08-19 DIAGNOSIS — Z79899 Other long term (current) drug therapy: Secondary | ICD-10-CM | POA: Diagnosis not present

## 2019-08-21 ENCOUNTER — Other Ambulatory Visit: Payer: Self-pay | Admitting: Obstetrics and Gynecology

## 2019-09-10 DIAGNOSIS — M25551 Pain in right hip: Secondary | ICD-10-CM | POA: Diagnosis not present

## 2019-09-10 DIAGNOSIS — S76211A Strain of adductor muscle, fascia and tendon of right thigh, initial encounter: Secondary | ICD-10-CM | POA: Diagnosis not present

## 2019-09-10 DIAGNOSIS — W1842XA Slipping, tripping and stumbling without falling due to stepping into hole or opening, initial encounter: Secondary | ICD-10-CM | POA: Diagnosis not present

## 2019-09-18 DIAGNOSIS — L700 Acne vulgaris: Secondary | ICD-10-CM | POA: Diagnosis not present

## 2019-11-04 DIAGNOSIS — E538 Deficiency of other specified B group vitamins: Secondary | ICD-10-CM | POA: Diagnosis not present

## 2019-11-04 DIAGNOSIS — Z0000 Encounter for general adult medical examination without abnormal findings: Secondary | ICD-10-CM | POA: Diagnosis not present

## 2019-11-04 DIAGNOSIS — R7303 Prediabetes: Secondary | ICD-10-CM | POA: Diagnosis not present

## 2019-11-04 DIAGNOSIS — Z862 Personal history of diseases of the blood and blood-forming organs and certain disorders involving the immune mechanism: Secondary | ICD-10-CM | POA: Diagnosis not present

## 2019-11-11 DIAGNOSIS — E7800 Pure hypercholesterolemia, unspecified: Secondary | ICD-10-CM | POA: Diagnosis not present

## 2019-11-11 DIAGNOSIS — R7303 Prediabetes: Secondary | ICD-10-CM | POA: Diagnosis not present

## 2019-11-11 DIAGNOSIS — Z0000 Encounter for general adult medical examination without abnormal findings: Secondary | ICD-10-CM | POA: Diagnosis not present

## 2019-11-27 NOTE — Progress Notes (Signed)
11/28/2019 8:44 AM   Erika Salazar Jun 11, 1981 [id number]  Referring provider: Dion Body, MD [facility] [place],   [id number]  Chief Complaint  Patient presents with  . Hematuria    HPI: Patient is a 38 -year-old female with a history of hematuria and rUTI's who presents today for follow up.  History of hematuria (low risk)   Non smoker.  CTU 04/2019 NED.  Cysto with Dr. [name] in 05/2019 was NED.  She has not had any episodes of gross hematuria since we last visited.[name] UA negative for microscopic hematuria.  History of rUTI's Risk factors:  Sexually active Enterococcus faecalis 05/06/2019 E. Coli resistant to Cipro/Levaquin 04/16/2019 On post-coital Macrobid -she takes the Junior after intercourse only if she becomes symptomatic and she states that this is working well for her   PMH: Past Medical History:  Diagnosis Date  . Amenorrhea   . ASCUS with positive high risk HPV   . BV (bacterial vaginosis)   . Elevated glucose   . Severe dysplasia of cervix   . Vaginal cyst     Surgical History: Past Surgical History:  Procedure Laterality Date  . CERVICAL BIOPSY  W/ LOOP ELECTRODE EXCISION  2014   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- POST LEEP ECC- 05/2014 NEG    Home Medications:  Allergies as of 11/28/2019   No Known Allergies     Medication List       Accurate as of November 28, 2019  8:44 AM. If you have any questions, ask your nurse or doctor.        STOP taking these medications   ISOtretinoin 20 MG capsule Commonly known as: ACCUTANE Stopped by: [name], PA-C     TAKE these medications   Blisovi 24 Fe 1-20 MG-MCG(24) tablet Generic drug: Norethindrone Acetate-Ethinyl Estrad-FE TAKE 1 TABLET BY MOUTH EVERY DAY   cholecalciferol 10 MCG (400 UNIT) Tabs tablet Commonly known as: VITAMIN D3 Take 400 Units by mouth daily.   nitrofurantoin 50 MG capsule Commonly known as: MACRODANTIN 1 capsule  by mouth after intercourse   vitamin B-12 100 MCG tablet Commonly known as: CYANOCOBALAMIN Take 100 mcg by mouth daily.       Allergies: No Known Allergies  Family History: Family History  Problem Relation Age of Onset  . Diabetes Father   . Hypertension Father   . Diabetes Mother   . Hypertension Mother   . Cancer Neg Hx   . Heart disease Neg Hx   . Kidney cancer Neg Hx   . Kidney disease Neg Hx   . Sickle cell trait Neg Hx   . Prostate cancer Neg Hx   . Tuberculosis Neg Hx     Social History:  reports that she has never smoked. She has never used smokeless tobacco. She reports current alcohol use. She reports that she does not use drugs.  ROS: UROLOGY Frequent Urination?: No Hard to postpone urination?: No Burning/pain with urination?: No Get up at night to urinate?: No Leakage of urine?: No Urine stream starts and stops?: No Trouble starting stream?: No Do you have to strain to urinate?: No Blood in urine?: No Urinary tract infection?: No Sexually transmitted disease?: No Injury to kidneys or bladder?: No Painful intercourse?: No Weak stream?: No Currently pregnant?: No Vaginal bleeding?: No Last menstrual period?: n  Gastrointestinal Nausea?: No Vomiting?: No Indigestion/heartburn?: No Diarrhea?: No Constipation?: No  Constitutional Fever: No Night sweats?: No Weight loss?: No Fatigue?: No  Skin Skin rash/lesions?: No Itching?: No  Eyes Blurred vision?: No Double vision?: No  Ears/Nose/Throat Sore throat?: No Sinus problems?: No  Hematologic/Lymphatic Swollen glands?: No Easy bruising?: No  Cardiovascular Leg swelling?: No Chest pain?: No  Respiratory Cough?: No Shortness of breath?: No  Endocrine Excessive thirst?: No  Musculoskeletal Back pain?: No Joint pain?: No  Neurological Headaches?: No Dizziness?: No  Psychologic Depression?: No Anxiety?: No  Physical Exam: BP 121/72   Pulse 75   Ht 5\' 6"  (1.676 m)    Wt 157 lb (71.2 kg)   BMI 25.34 kg/m   Constitutional:  Well nourished. Alert and oriented, No acute distress. HEENT: [place] AT, moist mucus membranes.  Trachea midline, no masses. Cardiovascular: No clubbing, cyanosis, or edema. Respiratory: Normal respiratory effort, no increased work of breathing. Neurologic: Grossly intact, no focal deficits, moving all 4 extremities. Psychiatric: Normal mood and affect.   Laboratory Data: Urinalysis Component     Latest Ref Rng & Units 11/28/2019  Specific Gravity, UA     1.005 - 1.030 1.025  pH, UA     5.0 - 7.5 5.5  Color, UA     Yellow Yellow  Appearance Ur     Clear Clear  Leukocytes,UA     Negative Negative  Protein,UA     Negative/Trace Negative  Glucose, UA     Negative Negative  Ketones, UA     Negative Negative  RBC, UA     Negative 2+ (A)  Bilirubin, UA     Negative Negative  Urobilinogen, Ur     0.2 - 1.0 mg/dL 0.2  Nitrite, UA     Negative Negative  Microscopic Examination      See below:   Component     Latest Ref Rng & Units 11/28/2019  WBC, UA     0 - 5 /hpf 0-5  RBC     0 - 2 /hpf 0-2  Epithelial Cells (non renal)     0 - 10 /hpf 0-10  Bacteria, UA     None seen/Few Few    I have reviewed the labs.  Assessment & Plan:    1. History of hematuria Hematuria work up completed in 05/2019 - findings positive for NED No report of gross hematuria  UA today negative for microscopic hematuria  RTC in one year for UA - patient to report any gross hematuria in the interim    2. History of rUTI's Doing well with as needed postcoital Macrobid Will return in 1 year for symptom recheck                                       Return in about 1 year (around 11/27/2020) for UA and symptoms recheck .  These notes generated with voice recognition software. I apologize for typographical errors.  14/09/2020, PA-C  [facility] [address] [place], [place] [phone]

## 2019-11-28 ENCOUNTER — Other Ambulatory Visit: Payer: Self-pay

## 2019-11-28 ENCOUNTER — Encounter: Payer: Self-pay | Admitting: Urology

## 2019-11-28 ENCOUNTER — Ambulatory Visit: Payer: 59 | Admitting: Urology

## 2019-11-28 VITALS — BP 121/72 | HR 75 | Ht 66.0 in | Wt 157.0 lb

## 2019-11-28 DIAGNOSIS — Z87440 Personal history of urinary (tract) infections: Secondary | ICD-10-CM

## 2019-11-28 DIAGNOSIS — Z87448 Personal history of other diseases of urinary system: Secondary | ICD-10-CM | POA: Diagnosis not present

## 2019-11-28 LAB — URINALYSIS, COMPLETE
Bilirubin, UA: NEGATIVE
Glucose, UA: NEGATIVE
Ketones, UA: NEGATIVE
Leukocytes,UA: NEGATIVE
Nitrite, UA: NEGATIVE
Protein,UA: NEGATIVE
Specific Gravity, UA: 1.025 (ref 1.005–1.030)
Urobilinogen, Ur: 0.2 mg/dL (ref 0.2–1.0)
pH, UA: 5.5 (ref 5.0–7.5)

## 2019-11-28 LAB — MICROSCOPIC EXAMINATION

## 2019-11-28 MED ORDER — NITROFURANTOIN MACROCRYSTAL 50 MG PO CAPS: ORAL_CAPSULE | ORAL | 1 refills | Status: DC

## 2020-01-12 ENCOUNTER — Other Ambulatory Visit: Payer: Self-pay | Admitting: Obstetrics and Gynecology

## 2020-01-13 NOTE — Telephone Encounter (Signed)
Pt called to schedule an appt with [facility] for annual exam. Pt scheduled.

## 2020-01-13 NOTE — Telephone Encounter (Signed)
Please make sure patient has an annual scheduled in June or July.

## 2020-05-21 ENCOUNTER — Encounter: Payer: Self-pay | Admitting: Obstetrics and Gynecology

## 2020-05-21 ENCOUNTER — Other Ambulatory Visit: Payer: Self-pay

## 2020-05-21 ENCOUNTER — Ambulatory Visit (INDEPENDENT_AMBULATORY_CARE_PROVIDER_SITE_OTHER): Payer: 59 | Admitting: Obstetrics and Gynecology

## 2020-05-21 VITALS — BP 127/77 | HR 71 | Ht 66.0 in | Wt 169.8 lb

## 2020-05-21 DIAGNOSIS — E663 Overweight: Secondary | ICD-10-CM | POA: Diagnosis not present

## 2020-05-21 DIAGNOSIS — Z01419 Encounter for gynecological examination (general) (routine) without abnormal findings: Secondary | ICD-10-CM

## 2020-05-21 DIAGNOSIS — Z1231 Encounter for screening mammogram for malignant neoplasm of breast: Secondary | ICD-10-CM

## 2020-05-21 DIAGNOSIS — Z3041 Encounter for surveillance of contraceptive pills: Secondary | ICD-10-CM

## 2020-05-21 DIAGNOSIS — G479 Sleep disorder, unspecified: Secondary | ICD-10-CM | POA: Diagnosis not present

## 2020-05-21 MED ORDER — BLISOVI 24 FE 1-20 MG-MCG(24) PO TABS: 1.0000 | ORAL_TABLET | Freq: Every day | ORAL | 4 refills | Status: DC

## 2020-05-21 NOTE — Progress Notes (Signed)
Pt present for annual exam. Pt stated that she was doing well no problems.  

## 2020-05-21 NOTE — Progress Notes (Signed)
GYNECOLOGY ANNUAL PHYSICAL EXAM PROGRESS NOTE  Subjective:    Erika Salazar is a 39 y.o. G18P3003 female who presents for an annual exam.  The patient is sexually active. The patient wears seatbelts: yes. The patient participates in regular exercise: yes (~ 3 days per week but has been inconsistent recently). Has the patient ever been transfused or tattooed?: no.   The patient has the following concerns today. 1. She reports complaints of sleep disturbances lately. Has no problems falling asleep but wakes up in the middle of the night and is sometimes difficult to fall back asleep. 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  Has tried supplements (Melatonin) in the past which did not help. Does note that when she exercises, she is able to sleep bette during the night.  2. Considering going back to school for her Master's degree in nursing.   Gynecologic History  Menarche age: 39 No LMP recorded (lmp unknown). (Menstrual status: Oral contraceptives). Contraception: OCP (estrogen/progesterone)  History of STI's: Denies Last Pap: 06/04/2019. Results were: normal.  Reports h/o abnormal pap smears, and LEEP in 2015.    OB History  Gravida Para Term Preterm AB Living  3 3 3  0 0 3  SAB TAB Ectopic Multiple Live Births  0 0 0 0 3    # Outcome Date GA Lbr Len/2nd Weight Sex Delivery Anes PTL Lv  3 Term 2013   7 lb 8 oz (3.402 kg) M Vag-Spont   LIV  2 Term 2003   5 lb 9.6 oz (2.54 kg) M Vag-Spont   LIV  1 Term 1999   7 lb 9.6 oz (3.447 kg) F Vag-Spont   LIV    Past Medical History:  Diagnosis Date  . Amenorrhea   . ASCUS with positive high risk HPV   . BV (bacterial vaginosis)   . Elevated glucose   . Severe dysplasia of cervix   . Vaginal cyst     Past Surgical History:  Procedure Laterality Date  . CERVICAL BIOPSY  W/ LOOP ELECTRODE EXCISION  2014   CIN 2-3 W/POS ENDOCERVICAL  MARGIN- POST LEEP ECC- 05/2014 NEG    Family History  Problem Relation Age of Onset  . Diabetes Father   . Hypertension Father   . Diabetes Mother   . Hypertension Mother   . Cancer Neg Hx   . Heart disease Neg Hx   . Kidney cancer Neg Hx   . Kidney disease Neg Hx   . Sickle cell trait Neg Hx   . Prostate cancer Neg Hx   . Tuberculosis Neg Hx     Social History   Socioeconomic History  . Marital status: Married    Spouse name: Not on file  . Number of children: Not on file  . Years of education: Not on file  . Highest education level: Not on file  Occupational History  . Not on file  Tobacco Use  . Smoking status: Never Smoker  . Smokeless tobacco: Never Used  Substance and Sexual Activity  . Alcohol use: Yes    Comment: Occasional  . Drug use: No  . Sexual activity: Yes    Birth control/protection: None, Pill  Other Topics Concern  . Not on file  Social History Narrative  . Not on file   Social Determinants of Health   Financial Resource Strain:   .  Difficulty of Paying Living Expenses:   Food Insecurity:   . Worried About Programme researcher, broadcasting/film/video in the Last Year:   . Barista in the Last Year:   Transportation Needs:   . Freight forwarder (Medical):   [name] Lack of Transportation (Non-Medical):   Physical Activity:   . Days of Exercise per Week:   . Minutes of Exercise per Session:   Stress:   . Feeling of Stress :   Social Connections:   . Frequency of Communication with Friends and Family:   . Frequency of Social Gatherings with Friends and Family:   . Attends Religious Services:   . Active Member of Clubs or Organizations:   . Attends Banker Meetings:   [name] Marital Status:   Intimate Partner Violence:   . Fear of Current or Ex-Partner:   . Emotionally Abused:   [name] Physically Abused:   . Sexually Abused:     Current Outpatient Medications on File Prior to Visit  Medication Sig Dispense Refill  . BLISOVI 24 FE 1-20 MG-MCG(24)  tablet TAKE 1 TABLET BY MOUTH EVERY DAY 84 tablet 1  . cholecalciferol (VITAMIN D) 400 units TABS tablet Take 400 Units by mouth daily.    . nitrofurantoin (MACRODANTIN) 50 MG capsule 1 capsule by mouth after intercourse 30 capsule 1  . vitamin B-12 (CYANOCOBALAMIN) 100 MCG tablet Take 100 mcg by mouth daily.     No current facility-administered medications on file prior to visit.    No Known Allergies    Review of Systems Constitutional: negative for chills, fevers.  Positive for fatigue (sleep disturbances) Eyes: negative for irritation, redness and visual disturbance Ears, nose, mouth, throat, and face: negative for hearing loss, nasal congestion, snoring and tinnitus Respiratory: negative for asthma, cough, sputum Cardiovascular: negative for chest pain, dyspnea, exertional chest pressure/discomfort, irregular heart beat, palpitations and syncope Gastrointestinal: negative for abdominal pain, change in bowel habits, nausea and vomiting Genitourinary: negative for abnormal menstrual periods, genital lesions, sexual problems and vaginal discharge, dysuria and urinary incontinence Integument/breast: negative for breast lump, breast tenderness and nipple discharge Hematologic/lymphatic: negative for bleeding and easy bruising Musculoskeletal:negative for back pain and muscle weakness Neurological: negative for dizziness, headaches, vertigo and weakness Endocrine: negative for diabetic symptoms including polydipsia, polyuria and skin dryness Allergic/Immunologic: negative for hay fever and urticaria       Objective:  Blood pressure 127/77, pulse 71, height 5\' 6"  (1.676 m), weight 169 lb 12.8 oz (77 kg). Body mass index is 27.41 kg/m.  General Appearance:    Alert, cooperative, no distress, appears stated age, overweight  Head:    Normocephalic, without obvious abnormality, atraumatic  Eyes:    PERRL, conjunctiva/corneas clear, EOM's intact, both eyes  Ears:    Normal external ear  canals, both ears  Nose:   Nares normal, septum midline, mucosa normal, no drainage or sinus tenderness  Throat:   Lips, mucosa, and tongue normal; teeth and gums normal  Neck:   Supple, symmetrical, trachea midline, no adenopathy; thyroid: no enlargement/tenderness/nodules; no carotid bruit or JVD  Back:     Symmetric, no curvature, ROM normal, no CVA tenderness  Lungs:     Clear to auscultation bilaterally, respirations unlabored  Chest Wall:    No tenderness or deformity   Heart:    Regular rate and rhythm, S1 and S2 normal, no murmur, rub or gallop  Breast Exam:    No tenderness, masses, or nipple abnormality  Abdomen:  Soft, non-tender, bowel sounds active all four quadrants, no masses, no organomegaly.    Genitalia:    Pelvic:external genitalia normal, vagina without lesions, discharge, or tenderness, rectovaginal septum  normal. Cervix normal in appearance, no cervical motion tenderness, no adnexal masses or tenderness.  Uterus normal size, shape, mobile, regular contours, nontender.  Rectal:    Normal external sphincter.  No hemorrhoids appreciated. Internal exam not done.   Extremities:   Extremities normal, atraumatic, no cyanosis or edema  Pulses:   2+ and symmetric all extremities  Skin:   Skin color, texture, turgor normal, no rashes or lesions  Lymph nodes:   Cervical, supraclavicular, and axillary nodes normal  Neurologic:   CNII-XII intact, normal strength, sensation and reflexes throughout   .  Labs:  Lab Results  Component Value Date   WBC 12.3 (H) 10/09/2012   HGB 10.5 (L) 10/09/2012   HCT 29.2 (L) 10/10/2012   MCV 80 10/09/2012   PLT 189 10/09/2012    Lab Results  Component Value Date   CREATININE 0.90 05/06/2019   BUN 12 05/06/2019    Other labs reviewed in Care Everywhere  Assessment:   1. Encounter for well woman exam with routine gynecological exam   2. Breast cancer screening by mammogram   3. Sleep disturbances   4. Encounter for surveillance of  contraceptive pills   5. Overweight (BMI 25.0-29.9)    Plan:     Blood tests: TSH.  Patient has had other labs done by PCP in October 2019, reviewed in [facility].  Breast self exam technique reviewed and patient encouraged to perform self-exam monthly. Contraception: OCP (estrogen/progesterone). Refill given.  Discussed healthy lifestyle modifications. Pap smear up to date.  Mammogram ordered. Due for initial screening this year.  Discussion had on stress management, as this is likely the cause of her sleep disturbances (new job responsibilities as Librarian, academic). Also had changed from night shift to day shift, but has had several months to adjust so should not be a major factor. Patient notes improvement with exercising, also discussed other stress management options such as meditation.  Follow up in 1 year, or sooner as needed.     Rubie Maid, MD [facility]

## 2020-05-21 NOTE — Patient Instructions (Addendum)
Preventive Care 41-41 Years Old, Female Preventive care refers to visits with your health care provider and lifestyle choices that can promote health and wellness. This includes:  A yearly physical exam. This may also be called an annual well check.  Regular dental visits and eye exams.  Immunizations.  Screening for certain conditions.  Healthy lifestyle choices, such as eating a healthy diet, getting regular exercise, not using drugs or products that contain nicotine and tobacco, and limiting alcohol use. What can I expect for my preventive care visit? Physical exam Your health care provider will check your:  Height and weight. This may be used to calculate body mass index (BMI), which tells if you are at a healthy weight.  Heart rate and blood pressure.  Skin for abnormal spots. Counseling Your health care provider may ask you questions about your:  Alcohol, tobacco, and drug use.  Emotional well-being.  Home and relationship well-being.  Sexual activity.  Eating habits.  Work and work environment.  Method of birth control.  Menstrual cycle.  Pregnancy history. What immunizations do I need?  Influenza (flu) vaccine  This is recommended every year. Tetanus, diphtheria, and pertussis (Tdap) vaccine  You may need a Td booster every 10 years. Varicella (chickenpox) vaccine  You may need this if you have not been vaccinated. Zoster (shingles) vaccine  You may need this after age 60. Measles, mumps, and rubella (MMR) vaccine  You may need at least one dose of MMR if you were born in 1957 or later. You may also need a second dose. Pneumococcal conjugate (PCV13) vaccine  You may need this if you have certain conditions and were not previously vaccinated. Pneumococcal polysaccharide (PPSV23) vaccine  You may need one or two doses if you smoke cigarettes or if you have certain conditions. Meningococcal conjugate (MenACWY) vaccine  You may need this if you  have certain conditions. Hepatitis A vaccine  You may need this if you have certain conditions or if you travel or work in places where you may be exposed to hepatitis A. Hepatitis B vaccine  You may need this if you have certain conditions or if you travel or work in places where you may be exposed to hepatitis B. Haemophilus influenzae type b (Hib) vaccine  You may need this if you have certain conditions. Human papillomavirus (HPV) vaccine  If recommended by your health care provider, you may need three doses over 6 months. You may receive vaccines as individual doses or as more than one vaccine together in one shot (combination vaccines). Talk with your health care provider about the risks and benefits of combination vaccines. What tests do I need? Blood tests  Lipid and cholesterol levels. These may be checked every 5 years, or more frequently if you are over 50 years old.  Hepatitis C test.  Hepatitis B test. Screening  Lung cancer screening. You may have this screening every year starting at age 55 if you have a 30-pack-year history of smoking and currently smoke or have quit within the past 15 years.  Colorectal cancer screening. All adults should have this screening starting at age 50 and continuing until age 75. Your health care provider may recommend screening at age 45 if you are at increased risk. You will have tests every 1-10 years, depending on your results and the type of screening test.  Diabetes screening. This is done by checking your blood sugar (glucose) after you have not eaten for a while (fasting). You may have this   done every 1-3 years.  Mammogram. This may be done every 1-2 years. Talk with your health care provider about when you should start having regular mammograms. This may depend on whether you have a family history of breast cancer.  BRCA-related cancer screening. This may be done if you have a family history of breast, ovarian, tubal, or peritoneal  cancers.  Pelvic exam and Pap test. This may be done every 3 years starting at age 70. Starting at age 87, this may be done every 5 years if you have a Pap test in combination with an HPV test. Other tests  Sexually transmitted disease (STD) testing.  Bone density scan. This is done to screen for osteoporosis. You may have this scan if you are at high risk for osteoporosis. Follow these instructions at home: Eating and drinking  Eat a diet that includes fresh fruits and vegetables, whole grains, lean protein, and low-fat dairy.  Take vitamin and mineral supplements as recommended by your health care provider.  Do not drink alcohol if: ? Your health care provider tells you not to drink. ? You are pregnant, may be pregnant, or are planning to become pregnant.  If you drink alcohol: ? Limit how much you have to 0-1 drink a day. ? Be aware of how much alcohol is in your drink. In the [place] one drink equals one 12 oz bottle of beer (355 mL), one 5 oz glass of wine (148 mL), or one 1 oz glass of hard liquor (44 mL). Lifestyle  Take daily care of your teeth and gums.  Stay active. Exercise for at least 30 minutes on 5 or more days each week.  Do not use any products that contain nicotine or tobacco, such as cigarettes, e-cigarettes, and chewing tobacco. If you need help quitting, ask your health care provider.  If you are sexually active, practice safe sex. Use a condom or other form of birth control (contraception) in order to prevent pregnancy and STIs (sexually transmitted infections).  If told by your health care provider, take low-dose aspirin daily starting at age 41. What's next?  Visit your health care provider once a year for a well check visit.  Ask your health care provider how often you should have your eyes and teeth checked.  Stay up to date on all vaccines. This information is not intended to replace advice given to you by your health care provider. Make sure you  discuss any questions you have with your health care provider. Document Revised: 08/16/2018 Document Reviewed: 08/16/2018 Elsevier Patient Education  2020 [employer] Breast self-awareness is knowing how your breasts look and feel. Doing breast self-awareness is important. It allows you to catch a breast problem early while it is still small and can be treated. All women should do breast self-awareness, including women who have had breast implants. Tell your doctor if you notice a change in your breasts. What you need:  A mirror.  A well-lit room. How to do a breast self-exam A breast self-exam is one way to learn what is normal for your breasts and to check for changes. To do a breast self-exam: Look for changes  1. Take off all the clothes above your waist. 2. Stand in front of a mirror in a room with good lighting. 3. Put your hands on your hips. 4. Push your hands down. 5. Look at your breasts and nipples in the mirror to see if one breast or nipple looks different  other. Check to see if: ? The shape of one breast is different. ? The size of one breast is different. ? There are wrinkles, dips, and bumps in one breast and not the other. 6. Look at each breast for changes in the skin, such as: ? Redness. ? Scaly areas. 7. Look for changes in your nipples, such as: ? Liquid around the nipples. ? Bleeding. ? Dimpling. ? Redness. ? A change in where the nipples are. Feel for changes  1. Lie on your back on the floor. 2. Feel each breast. To do this, follow these steps: ? Pick a breast to feel. ? Put the arm closest to that breast above your head. ? Use your other arm to feel the nipple area of your breast. Feel the area with the pads of your three middle fingers by making small circles with your fingers. For the first circle, press lightly. For the second circle, press harder. For the third circle, press even harder. ? Keep making circles with  your fingers at the different pressures as you move down your breast. Stop when you feel your ribs. ? Move your fingers a little toward the center of your body. ? Start making circles with your fingers again, this time going up until you reach your collarbone. ? Keep making up-and-down circles until you reach your armpit. Remember to keep using the three pressures. ? Feel the other breast in the same way. 3. Sit or stand in the tub or shower. 4. With soapy water on your skin, feel each breast the same way you did in step 2 when you were lying on the floor. Write down what you find Writing down what you find can help you remember what to tell your doctor. Write down:  What is normal for each breast.  Any changes you find in each breast, including: ? The kind of changes you find. ? Whether you have pain. ? Size and location of any lumps.  When you last had your menstrual period. General tips  Check your breasts every month.  If you are breastfeeding, the best time to check your breasts is after you feed your baby or after you use a breast pump.  If you get menstrual periods, the best time to check your breasts is 5-7 days after your menstrual period is over.  With time, you will become comfortable with the self-exam, and you will begin to know if there are changes in your breasts. Contact a doctor if you:  See a change in the shape or size of your breasts or nipples.  See a change in the skin of your breast or nipples, such as red or scaly skin.  Have fluid coming from your nipples that is not normal.  Find a lump or thick area that was not there before.  Have pain in your breasts.  Have any concerns about your breast health. Summary  Breast self-awareness includes looking for changes in your breasts, as well as feeling for changes within your breasts.  Breast self-awareness should be done in front of a mirror in a well-lit room.  You should check your breasts every month.  If you get menstrual periods, the best time to check your breasts is 5-7 days after your menstrual period is over.  Let your doctor know of any changes you see in your breasts, including changes in size, changes on the skin, pain or tenderness, or fluid from your nipples that is not normal. This information is not   is not intended to replace advice given to you by your health care provider. Make sure you discuss any questions you have with your health care provider. Document Revised: 07/24/2018 Document Reviewed: 07/24/2018 Elsevier Patient Education  [employer].

## 2020-06-23 IMAGING — CT CT ABDOMEN AND PELVIS WITHOUT AND WITH CONTRAST
2 of 8 series · 13 of 46 positions shown, 18 images · IV contrast (omnipaque)
Comparison: None

CLINICAL DATA: Recurrent urinary tract infection.

EXAM:
CT ABDOMEN AND PELVIS WITHOUT AND WITH CONTRAST
TECHNIQUE: Multidetector CT imaging of the abdomen and pelvis was performed
following the standard protocol before and following the bolus
administration of intravenous contrast.
CONTRAST:  150mL OMNIPAQUE IOHEXOL 300 MG/ML  SOLN

[Series 5: cor without without pre · coronal · non-contrast · 0.62mm/px · 3 of 127 slices shown]
[im 32/127  soft-tissue]
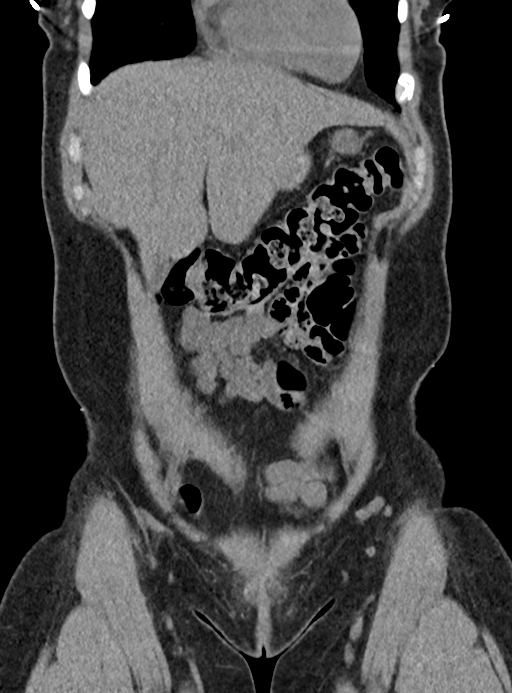
[im 64/127  soft-tissue]
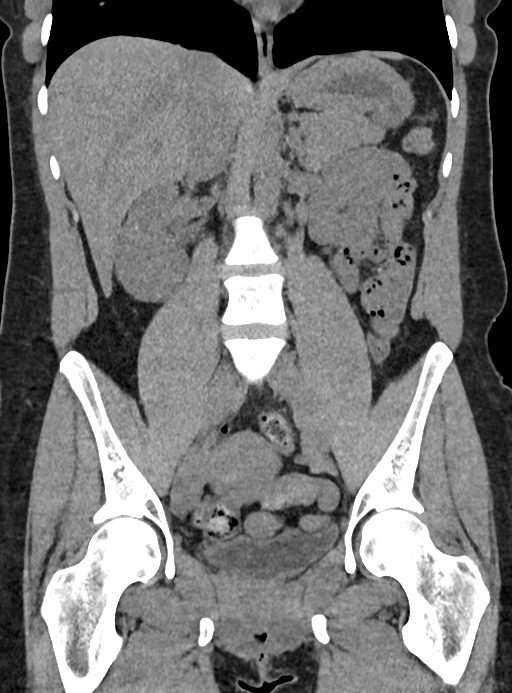
[im 95/127  soft-tissue]
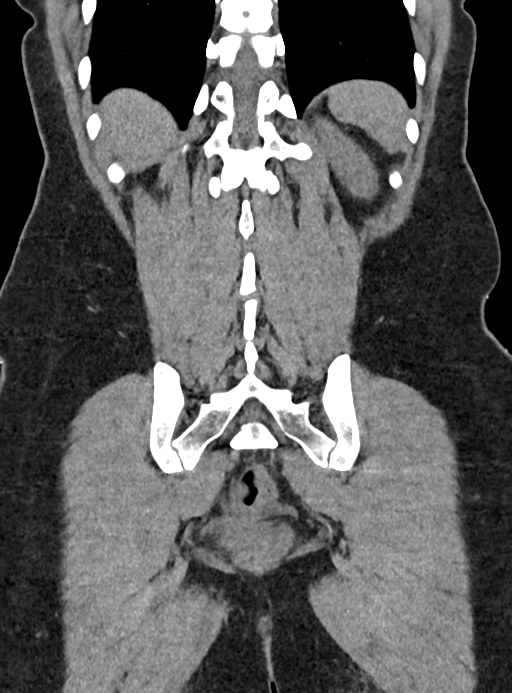

[Series 10: axial delay prone · axial · delayed · 0.62mm/px · z∈[-1467,-1072]mm · 10 of 95 slices shown, 15 images]
[im 8/95  soft-tissue]
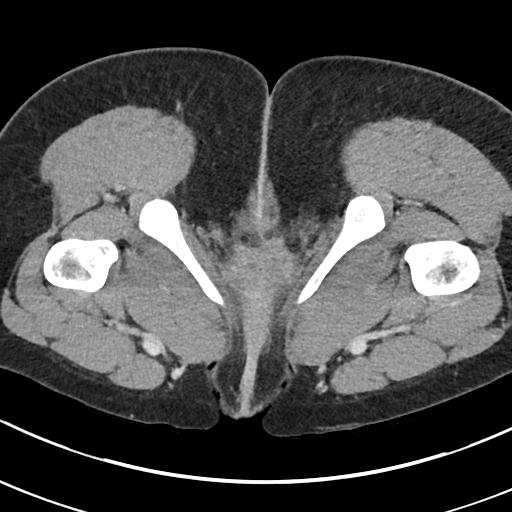
[im 8/95  bone]
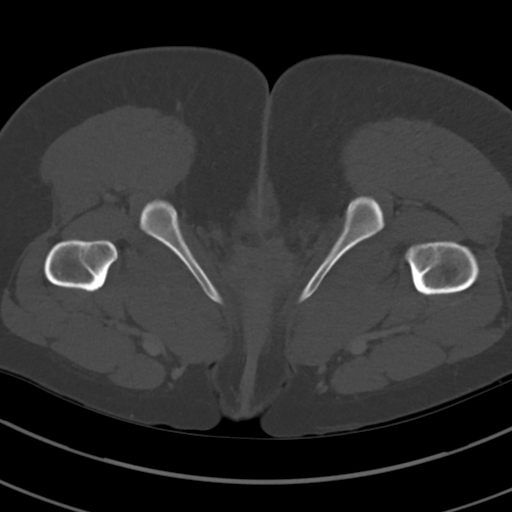
[im 16/95  soft-tissue]
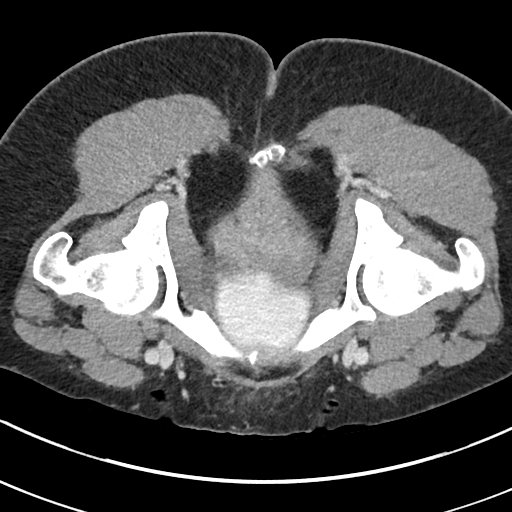
[im 32/95  soft-tissue]
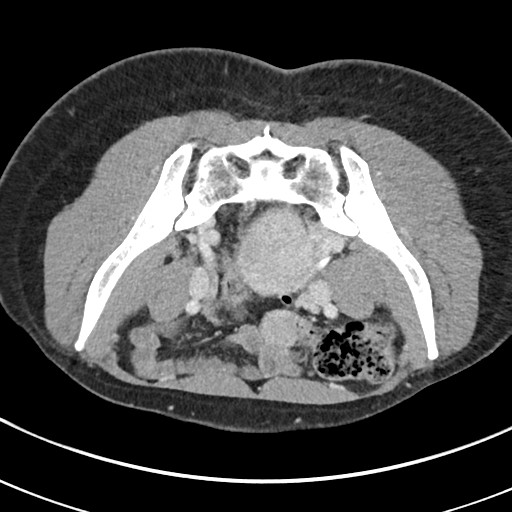
[im 40/95  soft-tissue]
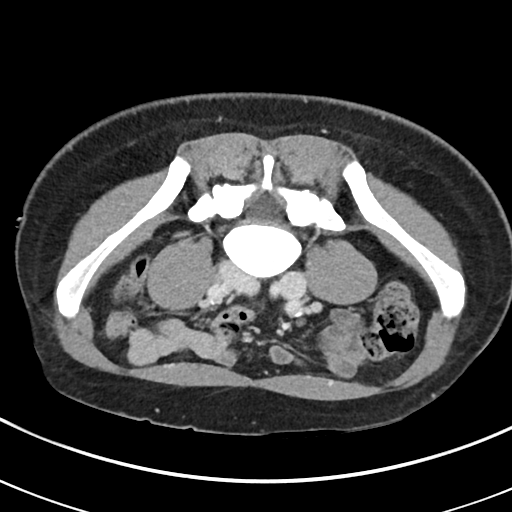
[im 48/95  soft-tissue]
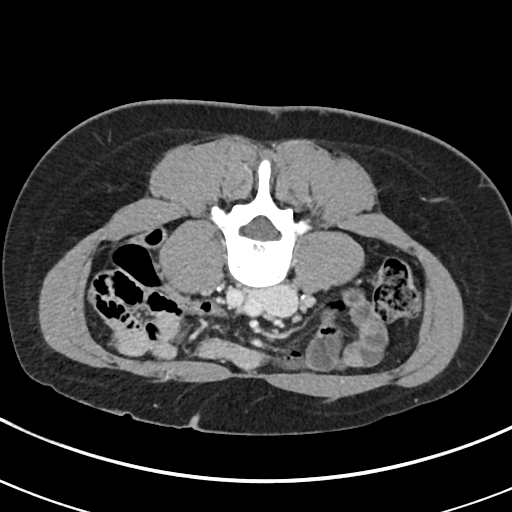
[im 55/95  soft-tissue]
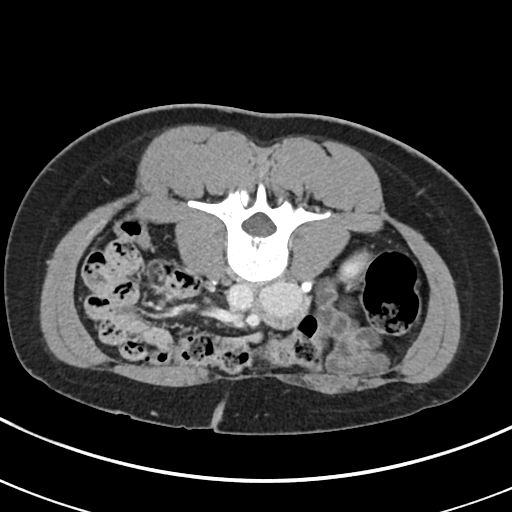
[im 63/95  soft-tissue]
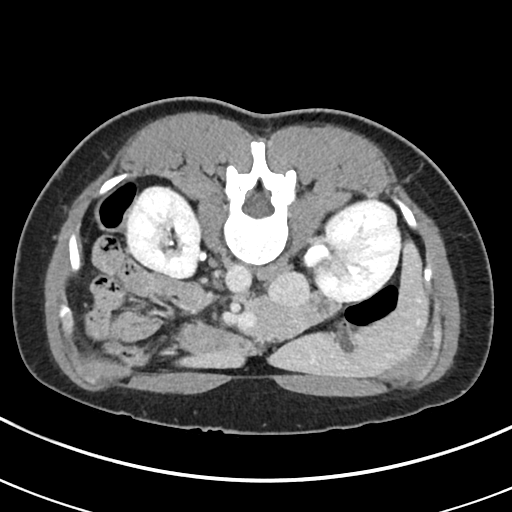
[im 63/95  lung]
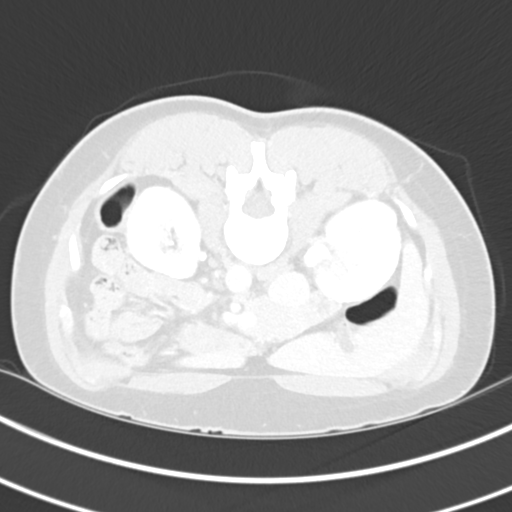
[im 71/95  lung]
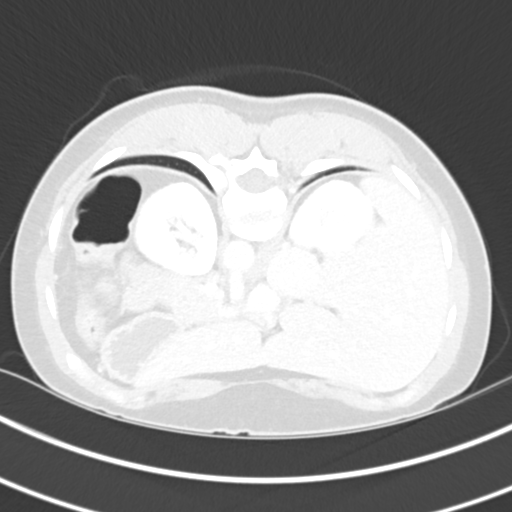
[im 79/95  soft-tissue]
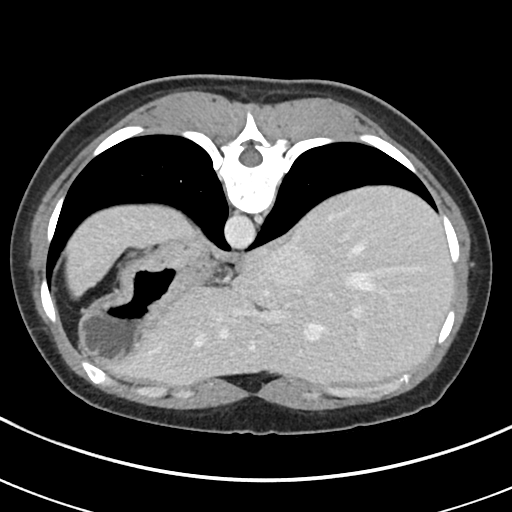
[im 79/95  lung]
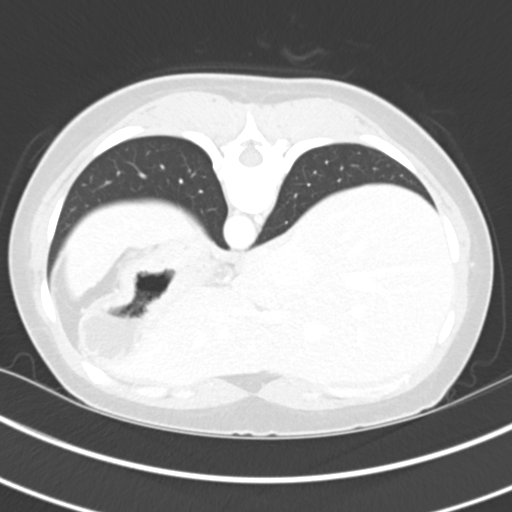
[im 87/95  soft-tissue]
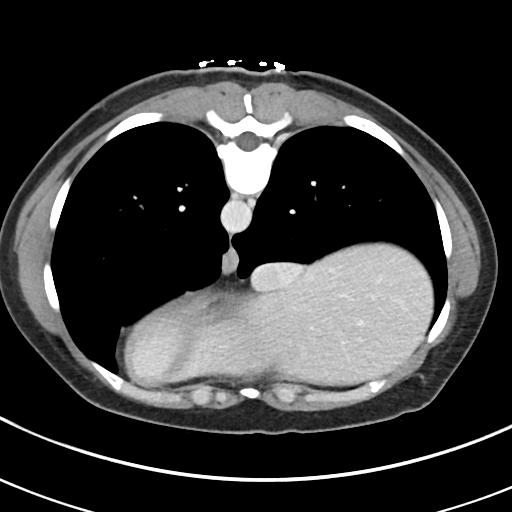
[im 87/95  lung]
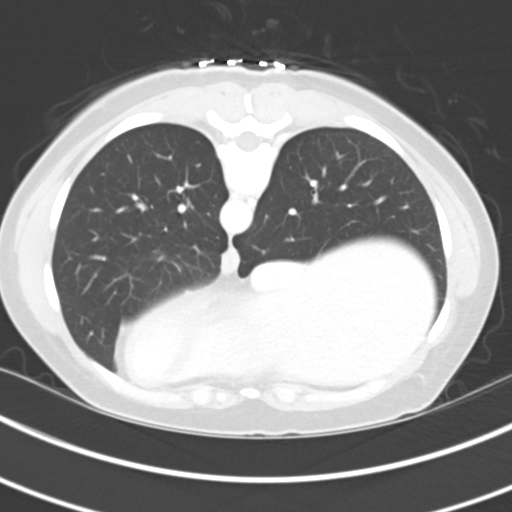
[im 87/95  bone]
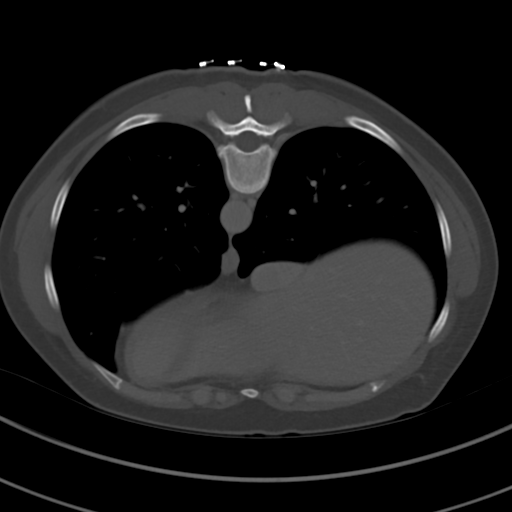

[13 of 46 positions shown; findings below may reference images not displayed]

FINDINGS: Lower chest: No acute abnormality.

Hepatobiliary: No focal liver abnormality is seen. No gallstones,
gallbladder wall thickening, or biliary dilatation.

Pancreas: Unremarkable. No pancreatic ductal dilatation or
surrounding inflammatory changes.

Spleen: Normal in size without focal abnormality.

Adrenals/Urinary Tract: Normal adrenal glands. No kidney stones
identified bilaterally. No hydronephrosis or hydroureter identified.
No kidney mass noted. Focal area of scarring within the midpole of
right kidney noted. Kidneys otherwise unremarkable. The urinary
bladder appears normal.

Stomach/Bowel: Stomach is within normal limits. Appendix appears
normal. No evidence of bowel wall thickening, distention, or
inflammatory changes. Normal appearance of the colon.

Vascular/Lymphatic: No significant vascular findings are present. No
enlarged abdominal or pelvic lymph nodes.

Reproductive: Uterus and bilateral adnexa are unremarkable.

Other: No free fluid or fluid collections.

Musculoskeletal: No acute or significant osseous findings.
IMPRESSION: 1. No acute findings within the abdomen or pelvis.
2. No nephrolithiasis or hydronephrosis.

## 2020-06-30 DIAGNOSIS — E7800 Pure hypercholesterolemia, unspecified: Secondary | ICD-10-CM | POA: Diagnosis not present

## 2020-06-30 DIAGNOSIS — R7303 Prediabetes: Secondary | ICD-10-CM | POA: Diagnosis not present

## 2020-06-30 DIAGNOSIS — Z1159 Encounter for screening for other viral diseases: Secondary | ICD-10-CM | POA: Diagnosis not present

## 2020-08-20 ENCOUNTER — Other Ambulatory Visit: Payer: Self-pay | Admitting: Obstetrics and Gynecology

## 2020-11-11 DIAGNOSIS — Z9229 Personal history of other drug therapy: Secondary | ICD-10-CM | POA: Diagnosis not present

## 2020-11-11 DIAGNOSIS — Z0000 Encounter for general adult medical examination without abnormal findings: Secondary | ICD-10-CM | POA: Diagnosis not present

## 2020-11-11 DIAGNOSIS — E538 Deficiency of other specified B group vitamins: Secondary | ICD-10-CM | POA: Diagnosis not present

## 2020-11-11 DIAGNOSIS — Z111 Encounter for screening for respiratory tuberculosis: Secondary | ICD-10-CM | POA: Diagnosis not present

## 2020-11-11 DIAGNOSIS — E559 Vitamin D deficiency, unspecified: Secondary | ICD-10-CM | POA: Diagnosis not present

## 2020-11-26 NOTE — Progress Notes (Signed)
11/27/2020 8:48 AM   Nikki Dom 1981/03/01 [id number]  Referring provider: Dion Body, MD [place] [facility] [place],  [place] [address]  Chief Complaint  Patient presents with  . Hematuria    HPI: Erika Salazar is a 39 y.o. 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    Low risk hematuria Non smoker.  CTU 04/2019 NED.  Cysto with Dr. [name] in 05/2019 was NED.  She denies any gross hematuria.  UA is negative for micro heme.    History of rUTI's Risk factors:  Sexually active No documented UTI's since last visit On post-coital Macrobid -she takes the [place] after intercourse only if she becomes symptomatic and she states that this is working well for her She has not needed to take the antibiotic this year    PMH: Past Medical History:  Diagnosis Date  . Amenorrhea   . ASCUS with positive high risk HPV   . BV (bacterial vaginosis)   . Elevated glucose   . Severe dysplasia of cervix   . Vaginal cyst     Surgical History: Past Surgical History:  Procedure Laterality Date  . CERVICAL BIOPSY  W/ LOOP ELECTRODE EXCISION  2014   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- POST LEEP ECC- 05/2014 NEG    Home Medications:  Allergies as of 11/27/2020   No Known Allergies     Medication List       Accurate as of November 27, 2020  8:48 AM. If you have any questions, ask your nurse or doctor.        [place] 24 Fe 1-20 MG-MCG(24) tablet Generic drug: Norethindrone Acetate-Ethinyl Estrad-FE TAKE 1 TABLET BY MOUTH EVERY DAY   Calcium Carbonate-Vitamin D 600-400 MG-UNIT tablet Take by mouth.   cholecalciferol 10 MCG (400 UNIT) Tabs tablet Commonly known as: VITAMIN D3 Take 400 Units by mouth daily.   nitrofurantoin 50 MG capsule Commonly known as: MACRODANTIN 1 capsule by mouth after intercourse   vitamin B-12 100 MCG tablet Commonly known as: CYANOCOBALAMIN Take 100  mcg by mouth daily.       Allergies: No Known Allergies  Family History: Family History  Problem Relation Age of Onset  . Diabetes Father   . Hypertension Father   . Diabetes Mother   . Hypertension Mother   . Cancer Neg Hx   . Heart disease Neg Hx   . Kidney cancer Neg Hx   . Kidney disease Neg Hx   . Sickle cell trait Neg Hx   . Prostate cancer Neg Hx   . Tuberculosis Neg Hx     Social History:  reports that she has never smoked. She has never used smokeless tobacco. She reports current alcohol use. She reports that she does not use drugs.  ROS: For pertinent review of systems please refer to history of present illness  Physical Exam: BP 128/75   Pulse 71   Ht $R'5\' 6"'uo$  (1.676 m)   Wt 165 lb (74.8 kg)   BMI 26.63 kg/m   Constitutional:  Well nourished. Alert and oriented, No acute distress. HEENT: [place] AT, mask in place.  Trachea midline Cardiovascular: No clubbing, cyanosis, or edema. Respiratory: Normal respiratory effort, no increased work of breathing. Neurologic: Grossly intact, no focal deficits, moving all 4 extremities. Psychiatric: Normal mood and affect.   Laboratory Data: Specimen:  Blood  Ref Range & Units 2 wk ago  Glucose 70 -  110 mg/dL 89   Sodium 136 - 145 mmol/L 139   Potassium 3.6 - 5.1 mmol/L 4.3   Chloride 97 - 109 mmol/L 106   Carbon Dioxide (CO2) 22.0 - 32.0 mmol/L 28.0   Urea Nitrogen (BUN) 7 - 25 mg/dL 13   Creatinine 0.6 - 1.1 mg/dL 0.8   Glomerular Filtration Rate (eGFR), MDRD Estimate >60 mL/min/1.73sq m 96   Calcium 8.7 - 10.3 mg/dL 9.3   AST  8 - 39 U/L 13   ALT  5 - 38 U/L 8   Alk Phos (alkaline Phosphatase) 34 - 104 U/L 32Low   Albumin 3.5 - 4.8 g/dL 4.1   Bilirubin, Total 0.3 - 1.2 mg/dL 0.6   Protein, Total 6.1 - 7.9 g/dL 7.1   A/G Ratio 1.0 - 5.0 gm/dL 1.4   Resulting Agency  [facility] - [facility]  Specimen Collected: 11/11/20 9:46 AM Last Resulted: 11/11/20 3:19 PM  Received From: [facility]   Result Received: 11/19/20 9:09 AM   Specimen:  Blood  Ref Range & Units 2 wk ago  WBC (White Blood Cell Count) 4.1 - 10.2 10^3/uL 5.2   RBC (Red Blood Cell Count) 4.04 - 5.48 10^6/uL 4.66   Hemoglobin 12.0 - 15.0 gm/dL 12.3   Hematocrit 35.0 - 47.0 % 39.1   MCV (Mean Corpuscular Volume) 80.0 - 100.0 fl 83.9   MCH (Mean Corpuscular Hemoglobin) 27.0 - 31.2 pg 26.4Low   MCHC (Mean Corpuscular Hemoglobin Concentration) 32.0 - 36.0 gm/dL 31.5Low   Platelet Count 150 - 450 10^3/uL 291   RDW-CV (Red Cell Distribution Width) 11.6 - 14.8 % 13.2   MPV (Mean Platelet Volume) 9.4 - 12.4 fl 9.5   Neutrophils 1.50 - 7.80 10^3/uL 3.23   Lymphocytes 1.00 - 3.60 10^3/uL 1.61   Monocytes 0.00 - 1.50 10^3/uL 0.30   Eosinophils 0.00 - 0.55 10^3/uL 0.07   Basophils 0.00 - 0.09 10^3/uL 0.01   Neutrophil % 32.0 - 70.0 % 62.0   Lymphocyte % 10.0 - 50.0 % 30.8   Monocyte % 4.0 - 13.0 % 5.7   Eosinophil % 1.0 - 5.0 % 1.3   Basophil% 0.0 - 2.0 % 0.2   Immature Granulocyte % <=0.7 % 0.0   Immature Granulocyte Count <=0.06 10^3/L 0.00   Resulting Agency  [facility] - [facility]  Specimen Collected: 11/11/20 9:46 AM Last Resulted: 11/11/20 10:19 AM  Received From: [facility]  Result Received: 11/26/20 8:15 AM   Specimen:  Blood  Ref Range & Units 2 wk ago Comments  Vitamin D, 25-Hydroxy - [facility] 30.0 - 100.0 ng/mL 26Low     Specimen:  Blood  Ref Range & Units 4 mo ago  Hemoglobin A1C 4.2 - 5.6 % 6.3High   Average Blood Glucose (Calc) mg/dL 134   Resulting Agency  [facility] - [facility]   Narrative  Normal Range:  4.2 - 5.6%  Increased Risk: 5.7 - 6.4%  Diabetes:    >= 6.5%  Glycemic Control for adults with diabetes: <7%  Specimen Collected: 06/30/20 7:48 AM Last Resulted: 06/30/20 11:58 AM  Received From: [facility]  Result Received: 11/19/20 9:09 AM  I have reviewed the labs.  Assessment & Plan:    1. Low risk  hematuria Hematuria work up completed in 05/2019 - findings positive for NED No reports of gross hematuria UA today is negative for micro heme Will return in 1 year for repeat UA She will report gross  hematuria in the interim If no micro heme or gross heme presents itself over the next year, we will release her from care  2. History of rUTI's Has not had urinary tract infections over the last year Encouraged patient to contact the office for symptoms of UTI                                      Return in about 1 year (around 11/27/2021) for UA and office visit .  These notes generated with voice recognition software. I apologize for typographical errors.  Zara Council, PA-C  [facility] [address] [place], [place] [address] [phone]

## 2020-11-27 ENCOUNTER — Encounter: Payer: Self-pay | Admitting: Urology

## 2020-11-27 ENCOUNTER — Ambulatory Visit (INDEPENDENT_AMBULATORY_CARE_PROVIDER_SITE_OTHER): Payer: 59 | Admitting: Urology

## 2020-11-27 ENCOUNTER — Other Ambulatory Visit: Payer: Self-pay

## 2020-11-27 VITALS — BP 128/75 | HR 71 | Ht 66.0 in | Wt 165.0 lb

## 2020-11-27 DIAGNOSIS — R319 Hematuria, unspecified: Secondary | ICD-10-CM

## 2020-11-27 LAB — URINALYSIS, COMPLETE
Bilirubin, UA: NEGATIVE
Glucose, UA: NEGATIVE
Ketones, UA: NEGATIVE
Leukocytes,UA: NEGATIVE
Nitrite, UA: NEGATIVE
Specific Gravity, UA: >1.03 — ABNORMAL HIGH (ref 1.005–1.030)
Urobilinogen, Ur: 0.2 mg/dL (ref 0.2–1.0)
pH, UA: 5.5 (ref 5.0–7.5)

## 2020-11-27 LAB — MICROSCOPIC EXAMINATION

## 2020-11-30 DIAGNOSIS — Z03818 Encounter for observation for suspected exposure to other biological agents ruled out: Secondary | ICD-10-CM | POA: Diagnosis not present

## 2021-01-04 ENCOUNTER — Ambulatory Visit
Admission: RE | Admit: 2021-01-04 | Discharge: 2021-01-04 | Disposition: A | Discharge status: Home / Self Care | Payer: 59 | Source: Ambulatory Visit | Attending: Obstetrics and Gynecology | Admitting: Obstetrics and Gynecology

## 2021-01-04 ENCOUNTER — Other Ambulatory Visit: Payer: Self-pay

## 2021-01-04 DIAGNOSIS — Z1231 Encounter for screening mammogram for malignant neoplasm of breast: Secondary | ICD-10-CM | POA: Diagnosis not present

## 2021-05-25 ENCOUNTER — Other Ambulatory Visit: Payer: Self-pay

## 2021-05-25 ENCOUNTER — Encounter: Payer: Self-pay | Admitting: Obstetrics and Gynecology

## 2021-05-25 ENCOUNTER — Ambulatory Visit (INDEPENDENT_AMBULATORY_CARE_PROVIDER_SITE_OTHER): Payer: 59 | Admitting: Obstetrics and Gynecology

## 2021-05-25 VITALS — BP 123/81 | HR 74 | Resp 16 | Ht 66.0 in | Wt 172.7 lb

## 2021-05-25 DIAGNOSIS — R311 Benign essential microscopic hematuria: Secondary | ICD-10-CM

## 2021-05-25 DIAGNOSIS — E785 Hyperlipidemia, unspecified: Secondary | ICD-10-CM | POA: Diagnosis not present

## 2021-05-25 DIAGNOSIS — E663 Overweight: Secondary | ICD-10-CM | POA: Diagnosis not present

## 2021-05-25 DIAGNOSIS — R300 Dysuria: Secondary | ICD-10-CM

## 2021-05-25 DIAGNOSIS — Z1159 Encounter for screening for other viral diseases: Secondary | ICD-10-CM

## 2021-05-25 DIAGNOSIS — Z114 Encounter for screening for human immunodeficiency virus [HIV]: Secondary | ICD-10-CM

## 2021-05-25 DIAGNOSIS — R7303 Prediabetes: Secondary | ICD-10-CM | POA: Diagnosis not present

## 2021-05-25 DIAGNOSIS — Z01419 Encounter for gynecological examination (general) (routine) without abnormal findings: Secondary | ICD-10-CM

## 2021-05-25 DIAGNOSIS — Z1231 Encounter for screening mammogram for malignant neoplasm of breast: Secondary | ICD-10-CM | POA: Diagnosis not present

## 2021-05-25 LAB — POCT URINALYSIS DIPSTICK OB
Bilirubin, UA: NEGATIVE
Glucose, UA: NEGATIVE
Ketones, UA: NEGATIVE
Leukocytes, UA: NEGATIVE
Nitrite, UA: NEGATIVE
Spec Grav, UA: 1.025 (ref 1.010–1.025)
Urobilinogen, UA: 0.2 E.U./dL
pH, UA: 6 (ref 5.0–8.0)

## 2021-05-25 NOTE — Progress Notes (Signed)
GYNECOLOGY ANNUAL PHYSICAL EXAM PROGRESS NOTE  Subjective:    Erika Salazar is a 40 y.o. G50P3003 female who presents for an annual exam.  The patient is sexually active. The patient wears seatbelts: yes. The patient participates in regular exercise: yes (~ 3 days per week but has been inconsistent recently). Has the patient ever been transfused or tattooed?: no.   The patient has the following concerns today. 1. Mild discomfort with urination over the past few weeks.  Denies hematuria or back pain .  Gynecologic History  Menarche age: 50 No LMP recorded. (Menstrual status: Oral contraceptives). Contraception: OCP (estrogen/progesterone)  History of STI's: Denies Last Pap: 06/04/2019. Results were: normal.  Reports h/o abnormal pap smears, and LEEP in 2015.     Upstream - 05/25/21 0816      Pregnancy Intention Screening   Does the patient want to become pregnant in the next year? No    Does the patient's partner want to become pregnant in the next year? No    Would the patient like to discuss contraceptive options today? No      Contraception Wrap Up   Current Method Oral Contraceptive    End Method Oral Contraceptive    Contraception Counseling Provided No          The pregnancy intention screening data noted above was reviewed. Potential methods of contraception were not discussed. The patient elected to continue with Oral Contraceptive.   OB History  Gravida Para Term Preterm AB Living  3 3 3  0 0 3  SAB IAB Ectopic Multiple Live Births  0 0 0 0 3    # Outcome Date GA Lbr Len/2nd Weight Sex Delivery Anes PTL Lv  3 Term 2013   7 lb 8 oz (3.402 kg) M Vag-Spont   LIV  2 Term 2003   5 lb 9.6 oz (2.54 kg) M Vag-Spont   LIV  1 Term 1999   7 lb 9.6 oz (3.447 kg) F Vag-Spont   LIV    Past Medical History:  Diagnosis Date  . Amenorrhea   . ASCUS with positive high risk HPV   . BV (bacterial vaginosis)   . Elevated glucose   . Severe dysplasia of cervix   . Vaginal  cyst     Past Surgical History:  Procedure Laterality Date  . 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 Diabetes Father   . Hypertension Father   . Diabetes Mother   . Hypertension Mother   . Cancer Neg Hx   . Heart disease Neg Hx   . Kidney cancer Neg Hx   . Kidney disease Neg Hx   . Sickle cell trait Neg Hx   . Prostate cancer Neg Hx   . Tuberculosis Neg Hx     Social History   Socioeconomic History  . Marital status: Married    Spouse name: Not on file  . Number of children: Not on file  . Years of education: Not on file  . Highest education level: Not on file  Occupational History  . Not on file  Tobacco Use  . Smoking status: Never Smoker  . Smokeless tobacco: Never Used  Vaping Use  . Vaping Use: Never used  Substance and Sexual Activity  . Alcohol use: Yes    Comment: Occasional  . Drug use:  No  . Sexual activity: Yes    Birth control/protection: None, Pill  Other Topics Concern  . Not on file  Social History Narrative  . Not on file   Social Determinants of Health   Financial Resource Strain: Not on file  Food Insecurity: Not on file  Transportation Needs: Not on file  Physical Activity: Not on file  Stress: Not on file  Social Connections: Not on file  Intimate Partner Violence: Not on file    Current Outpatient Medications on File Prior to Visit  Medication Sig Dispense Refill  . BLISOVI 24 FE 1-20 MG-MCG(24) tablet TAKE 1 TABLET BY MOUTH EVERY DAY 84 tablet 3  . Calcium Carbonate-Vitamin D 600-400 MG-UNIT tablet Take by mouth.    . cholecalciferol (VITAMIN D) 400 units TABS tablet Take 400 Units by mouth daily.    . nitrofurantoin (MACRODANTIN) 50 MG capsule 1 capsule by mouth after intercourse 30 capsule 1  . vitamin B-12 (CYANOCOBALAMIN) 100 MCG tablet Take 100 mcg by mouth daily.     No current  facility-administered medications on file prior to visit.    No Known Allergies    Review of Systems Constitutional: negative for chills, fevers.   Eyes: negative for irritation, redness and visual disturbance Ears, nose, mouth, throat, and face: negative for hearing loss, nasal congestion, snoring and tinnitus Respiratory: negative for asthma, cough, sputum Cardiovascular: negative for chest pain, dyspnea, exertional chest pressure/discomfort, irregular heart beat, palpitations and syncope Gastrointestinal: negative for abdominal pain, change in bowel habits, nausea and vomiting Genitourinary: negative for abnormal menstrual periods, genital lesions, sexual problems and vaginal discharge, dysuria and urinary incontinence Integument/breast: negative for breast lump, breast tenderness and nipple discharge Hematologic/lymphatic: negative for bleeding and easy bruising Musculoskeletal:negative for back pain and muscle weakness Neurological: negative for dizziness, headaches, vertigo and weakness Endocrine: negative for diabetic symptoms including polydipsia, polyuria and skin dryness Allergic/Immunologic: negative for hay fever and urticaria       Objective:  Blood pressure 123/81, pulse 74, resp. rate 16, height 5\' 6"  (1.676 m), weight 172 lb 11.2 oz (78.3 kg). Body mass index is 27.87 kg/m.  General Appearance:    Alert, cooperative, no distress, appears stated age, overweight  Head:    Normocephalic, without obvious abnormality, atraumatic  Eyes:    PERRL, conjunctiva/corneas clear, EOM's intact, both eyes  Ears:    Normal external ear canals, both ears  Nose:   Nares normal, septum midline, mucosa normal, no drainage or sinus tenderness  Throat:   Lips, mucosa, and tongue normal; teeth and gums normal  Neck:   Supple, symmetrical, trachea midline, no adenopathy; thyroid: no enlargement/tenderness/nodules; no carotid bruit or JVD  Back:     Symmetric, no curvature, ROM normal, no  CVA tenderness  Lungs:     Clear to auscultation bilaterally, respirations unlabored  Chest Wall:    No tenderness or deformity   Heart:    Regular rate and rhythm, S1 and S2 normal, no murmur, rub or gallop  Breast Exam:    No tenderness, masses, or nipple abnormality  Abdomen:     Soft, non-tender, bowel sounds active all four quadrants, no masses, no organomegaly.    Genitalia:    Pelvic:external genitalia normal, vagina without lesions, discharge, or tenderness, rectovaginal septum  normal. Cervix normal in appearance, no cervical motion tenderness, no adnexal masses or tenderness.  Uterus normal size, shape, mobile, regular contours, nontender.  Rectal:    Normal external sphincter.  No hemorrhoids appreciated. Internal exam  not done.   Extremities:   Extremities normal, atraumatic, no cyanosis or edema  Pulses:   2+ and symmetric all extremities  Skin:   Skin color, texture, turgor normal, no rashes or lesions  Lymph nodes:   Cervical, supraclavicular, and axillary nodes normal  Neurologic:   CNII-XII intact, normal strength, sensation and reflexes throughout   .  Labs:  Results for orders placed or performed in visit on 05/25/21  POC Urinalysis Dipstick OB  Result Value Ref Range   Color, UA     Clarity, UA     Glucose, UA Negative Negative   Bilirubin, UA Negative    Ketones, UA Negative    Spec Grav, UA 1.025 1.010 - 1.025   Blood, UA Large    pH, UA 6.0 5.0 - 8.0   POC,PROTEIN,UA Trace Negative, Trace, Small (1+), Moderate (2+), Large (3+), 4+   Urobilinogen, UA 0.2 0.2 or 1.0 E.U./dL   Nitrite, UA Negative    Leukocytes, UA Negative Negative   Appearance     Odor       Other labs reviewed in Care Everywhere  Assessment:   1. Encounter for well woman exam with routine gynecological exam   2. Dysuria   3. Prediabetes   4. Dyslipidemia   5. Benign essential microscopic hematuria   6. Breast cancer screening by mammogram   7. Overweight (BMI 25.0-29.9)    Plan:     Blood tests: None ordered.  Patient has had other labs done by PCP in October, reviewed in Care Everywhere. Notes prediabetes and mildly elevated lipids. Breast self exam technique reviewed and patient encouraged to perform self-exam monthly. Mammogram ordered.  Contraception: OCP (estrogen/progesterone).  Discussed healthy lifestyle modifications. Pap smear up to date.  Benign hematuria noted, has been worked up by Urology in the past, no findings.  Dysuria noted, mild, but UA otherwise negative. Encouraged hydration and use of OTC Azo or cranberry juice.  Follow up in 1 year, or sooner as needed.     Hildred Laser, MD [facility]

## 2021-05-25 NOTE — Patient Instructions (Signed)
Preventive Care 84-42 Years Old, Female Preventive care refers to lifestyle choices and visits with your health care provider that can promote health and wellness. This includes:  A yearly physical exam. This is also called an annual wellness visit.  Regular dental and eye exams.  Immunizations.  Screening for certain conditions.  Healthy lifestyle choices, such as: ? Eating a healthy diet. ? Getting regular exercise. ? Not using drugs or products that contain nicotine and tobacco. ? Limiting alcohol use. What can I expect for my preventive care visit? Physical exam Your health care provider will check your:  Height and weight. These may be used to calculate your BMI (body mass index). BMI is a measurement that tells if you are at a healthy weight.  Heart rate and blood pressure.  Body temperature.  Skin for abnormal spots. Counseling Your health care provider may ask you questions about your:  Past medical problems.  Family's medical history.  Alcohol, tobacco, and drug use.  Emotional well-being.  Home life and relationship well-being.  Sexual activity.  Diet, exercise, and sleep habits.  Work and work Statistician.  Access to firearms.  Method of birth control.  Menstrual cycle.  Pregnancy history. What immunizations do I need? Vaccines are usually given at various ages, according to a schedule. Your health care provider will recommend vaccines for you based on your age, medical history, and lifestyle or other factors, such as travel or where you work.   What tests do I need? Blood tests  Lipid and cholesterol levels. These may be checked every 5 years, or more often if you are over 3 years old.  Hepatitis C test.  Hepatitis B test. Screening  Lung cancer screening. You may have this screening every year starting at age 73 if you have a 30-pack-year history of smoking and currently smoke or have quit within the past 15 years.  Colorectal cancer  screening. ? All adults should have this screening starting at age 52 and continuing until age 17. ? Your health care provider may recommend screening at age 49 if you are at increased risk. ? You will have tests every 1-10 years, depending on your results and the type of screening test.  Diabetes screening. ? This is done by checking your blood sugar (glucose) after you have not eaten for a while (fasting). ? You may have this done every 1-3 years.  Mammogram. ? This may be done every 1-2 years. ? Talk with your health care provider about when you should start having regular mammograms. This may depend on whether you have a family history of breast cancer.  BRCA-related cancer screening. This may be done if you have a family history of breast, ovarian, tubal, or peritoneal cancers.  Pelvic exam and Pap test. ? This may be done every 3 years starting at age 10. ? Starting at age 11, this may be done every 5 years if you have a Pap test in combination with an HPV test. Other tests  STD (sexually transmitted disease) testing, if you are at risk.  Bone density scan. This is done to screen for osteoporosis. You may have this scan if you are at high risk for osteoporosis. Talk with your health care provider about your test results, treatment options, and if necessary, the need for more tests. Follow these instructions at home: Eating and drinking  Eat a diet that includes fresh fruits and vegetables, whole grains, lean protein, and low-fat dairy products.  Take vitamin and mineral supplements  as recommended by your health care provider.  Do not drink alcohol if: ? Your health care provider tells you not to drink. ? You are pregnant, may be pregnant, or are planning to become pregnant.  If you drink alcohol: ? Limit how much you have to 0-1 drink a day. ? Be aware of how much alcohol is in your drink. In the [place] one drink equals one 12 oz bottle of beer (355 mL), one 5 oz glass of  wine (148 mL), or one 1 oz glass of hard liquor (44 mL).   Lifestyle  Take daily care of your teeth and gums. Brush your teeth every morning and night with fluoride toothpaste. Floss one time each day.  Stay active. Exercise for at least 30 minutes 5 or more days each week.  Do not use any products that contain nicotine or tobacco, such as cigarettes, e-cigarettes, and chewing tobacco. If you need help quitting, ask your health care provider.  Do not use drugs.  If you are sexually active, practice safe sex. Use a condom or other form of protection to prevent STIs (sexually transmitted infections).  If you do not wish to become pregnant, use a form of birth control. If you plan to become pregnant, see your health care provider for a prepregnancy visit.  If told by your health care provider, take low-dose aspirin daily starting at age 48.  Find healthy ways to cope with stress, such as: ? Meditation, yoga, or listening to music. ? Journaling. ? Talking to a trusted person. ? Spending time with friends and family. Safety  Always wear your seat belt while driving or riding in a vehicle.  Do not drive: ? If you have been drinking alcohol. Do not ride with someone who has been drinking. ? When you are tired or distracted. ? While texting.  Wear a helmet and other protective equipment during sports activities.  If you have firearms in your house, make sure you follow all gun safety procedures. What's next?  Visit your health care provider once a year for an annual wellness visit.  Ask your health care provider how often you should have your eyes and teeth checked.  Stay up to date on all vaccines. This information is not intended to replace advice given to you by your health care provider. Make sure you discuss any questions you have with your health care provider. Document Revised: 09/08/2020 Document Reviewed: 08/16/2018 Elsevier Patient Education  2021 [place] Breast self-awareness means being familiar with how your breasts look and feel. It involves checking your breasts regularly and reporting any changes to your health care provider. Practicing breast self-awareness is important. Sometimes changes may not be harmful (are benign), but sometimes a change in your breasts can be a sign of a serious medical problem. It is important to learn how to do this procedure correctly so that you can catch problems early, when treatment is more likely to be successful. All women should practice breast self-awareness, including women who have had breast implants. What you need:  A mirror.  A well-lit room. How to do a breast self-exam A breast self-exam is one way to learn what is normal for your breasts and whether your breasts are changing. To do a breast self-exam: Look for changes 1. Remove all the clothing above your waist. 2. Stand in front of a mirror in a room with good lighting. 3. Put your hands on your hips. 4. Push  your hands firmly downward. 5. Compare your breasts in the mirror. Look for differences between them (asymmetry), such as: ? Differences in shape. ? Differences in size. ? Puckers, dips, and bumps in one breast and not the other. 6. Look at each breast for changes in the skin, such as: ? Redness. ? Scaly areas. 7. Look for changes in your nipples, such as: ? Discharge. ? Bleeding. ? Dimpling. ? Redness. ? A change in position.   Feel for changes Carefully feel your breasts for lumps and changes. It is best to do this while lying on your back on the floor, and again while sitting or standing in the tub or shower with soapy water on your skin. Feel each breast in the following way: 1. Place the arm on the side of the breast you are examining above your head. 2. Feel your breast with the other hand. 3. Start in the nipple area and make -inch (2 cm) overlapping circles to feel your breast. Use the  pads of your three middle fingers to do this. Apply light pressure, then medium pressure, then firm pressure. The light pressure will allow you to feel the tissue closest to the skin. The medium pressure will allow you to feel the tissue that is a little deeper. The firm pressure will allow you to feel the tissue close to the ribs. 4. Continue the overlapping circles, moving downward over the breast until you feel your ribs below your breast. 5. Move one finger-width toward the center of the body. Continue to use the -inch (2 cm) overlapping circles to feel your breast as you move slowly up toward your collarbone. 6. Continue the up-and-down exam using all three pressures until you reach your armpit.   Write down what you find Writing down what you find can help you remember what to discuss with your health care provider. Write down:  What is normal for each breast.  Any changes that you find in each breast, including: ? The kind of changes you find. ? Any pain or tenderness. ? Size and location of any lumps.  Where you are in your menstrual cycle, if you are still menstruating. General tips and recommendations  Examine your breasts every month.  If you are breastfeeding, the best time to examine your breasts is after a feeding or after using a breast pump.  If you menstruate, the best time to examine your breasts is 5-7 days after your period. Breasts are generally lumpier during menstrual periods, and it may be more difficult to notice changes.  With time and practice, you will become more familiar with the variations in your breasts and more comfortable with the exam. Contact a health care provider if you:  See a change in the shape or size of your breasts or nipples.  See a change in the skin of your breast or nipples, such as a reddened or scaly area.  Have unusual discharge from your nipples.  Find a lump or thick area that was not there before.  Have pain in your  breasts.  Have any concerns related to your breast health. Summary  Breast self-awareness includes looking for physical changes in your breasts, as well as feeling for any changes within your breasts.  Breast self-awareness should be performed in front of a mirror in a well-lit room.  You should examine your breasts every month. If you menstruate, the best time to examine your breasts is 5-7 days after your menstrual period.  Let your health  care provider know of any changes you notice in your breasts, including changes in size, changes on the skin, pain or tenderness, or unusual fluid from your nipples. This information is not intended to replace advice given to you by your health care provider. Make sure you discuss any questions you have with your health care provider. Document Revised: 07/24/2018 Document Reviewed: 07/24/2018 Elsevier Patient Education  [place].

## 2021-06-26 DIAGNOSIS — Z20828 Contact with and (suspected) exposure to other viral communicable diseases: Secondary | ICD-10-CM | POA: Diagnosis not present

## 2021-06-26 DIAGNOSIS — L309 Dermatitis, unspecified: Secondary | ICD-10-CM | POA: Diagnosis not present

## 2021-07-27 DIAGNOSIS — Z20822 Contact with and (suspected) exposure to covid-19: Secondary | ICD-10-CM | POA: Diagnosis not present

## 2021-08-04 DIAGNOSIS — H5213 Myopia, bilateral: Secondary | ICD-10-CM | POA: Diagnosis not present

## 2021-08-05 DIAGNOSIS — L2089 Other atopic dermatitis: Secondary | ICD-10-CM | POA: Diagnosis not present

## 2021-08-05 DIAGNOSIS — L700 Acne vulgaris: Secondary | ICD-10-CM | POA: Diagnosis not present

## 2021-09-12 ENCOUNTER — Other Ambulatory Visit: Payer: Self-pay | Admitting: Obstetrics and Gynecology

## 2021-12-03 ENCOUNTER — Ambulatory Visit: Payer: Self-pay | Admitting: Urology

## 2021-12-07 ENCOUNTER — Telehealth: Payer: 59 | Admitting: Physician Assistant

## 2021-12-07 DIAGNOSIS — J208 Acute bronchitis due to other specified organisms: Secondary | ICD-10-CM

## 2021-12-07 DIAGNOSIS — B9689 Other specified bacterial agents as the cause of diseases classified elsewhere: Secondary | ICD-10-CM

## 2021-12-07 MED ORDER — AZITHROMYCIN 250 MG PO TABS: ORAL_TABLET | ORAL | 0 refills | Status: AC

## 2021-12-07 MED ORDER — PREDNISONE 10 MG (21) PO TBPK
ORAL_TABLET | ORAL | 0 refills | Status: DC
Start: 2021-12-07 — End: 2021-12-26

## 2021-12-07 NOTE — Progress Notes (Signed)

## 2021-12-22 ENCOUNTER — Telehealth: Payer: 59 | Admitting: Nurse Practitioner

## 2021-12-22 DIAGNOSIS — R059 Cough, unspecified: Secondary | ICD-10-CM

## 2021-12-22 NOTE — Progress Notes (Signed)
Based on what you shared with me, I feel your condition warrants further evaluation and I recommend that you be seen in a face to face visit.  Since you have already had a first line treatment for cough we would like you to be seen in person. It would be best for a provider to listen to your lungs and check your vital signs. You may also need a chest Xray    NOTE: There will be NO CHARGE for this eVisit   If you are having a true medical emergency please call 911.      For an urgent face to face visit, [facility] has six urgent care centers for your convenience:     [facility] at [facility] Directions [phone] [address] [place], [place] [address]    [facility]) Get Driving Directions [phone] [address] [place], [place] [address]  [facility] Health Urgent Care Center [facility]) Get Driving Directions [phone] [address] [place],  [place]  [address]  [facility] Health Urgent Care at [facility] Get Driving Directions [phone] [address], [address] [place], [place] [address]   [facility] Health Urgent Care at [facility] Get Driving Directions  [phone] [address] [place], [place] [address]   [facility] Health Urgent Care at [facility] Directions [phone] [address], [address] [place], [place] [address]  Your MyChart E-visit questionnaire answers were reviewed by a board certified advanced clinical practitioner to complete your personal care plan based on your specific symptoms.  Thank you for using e-Visits.

## 2021-12-26 ENCOUNTER — Ambulatory Visit (INDEPENDENT_AMBULATORY_CARE_PROVIDER_SITE_OTHER): Payer: 59

## 2021-12-26 ENCOUNTER — Encounter: Payer: Self-pay | Admitting: Medical Oncology

## 2021-12-26 ENCOUNTER — Ambulatory Visit
Admission: EM | Admit: 2021-12-26 | Discharge: 2021-12-26 | Disposition: A | Discharge status: Home / Self Care | Payer: 59 | Attending: Medical Oncology | Admitting: Medical Oncology

## 2021-12-26 DIAGNOSIS — R0602 Shortness of breath: Secondary | ICD-10-CM

## 2021-12-26 DIAGNOSIS — R062 Wheezing: Secondary | ICD-10-CM | POA: Diagnosis not present

## 2021-12-26 DIAGNOSIS — R059 Cough, unspecified: Secondary | ICD-10-CM | POA: Diagnosis not present

## 2021-12-26 DIAGNOSIS — R053 Chronic cough: Secondary | ICD-10-CM | POA: Diagnosis not present

## 2021-12-26 MED ORDER — ALBUTEROL SULFATE HFA 108 (90 BASE) MCG/ACT IN AERS: 1.0000 | INHALATION_SPRAY | Freq: Four times a day (QID) | RESPIRATORY_TRACT | 0 refills | Status: DC | PRN

## 2021-12-26 MED ORDER — DOXYCYCLINE HYCLATE 100 MG PO CAPS: 100.0000 mg | ORAL_CAPSULE | Freq: Two times a day (BID) | ORAL | 0 refills | Status: DC

## 2021-12-26 MED ORDER — PREDNISONE 10 MG PO TABS: ORAL_TABLET | ORAL | 0 refills | Status: DC

## 2021-12-26 MED ORDER — BENZONATATE 200 MG PO CAPS: 200.0000 mg | ORAL_CAPSULE | Freq: Three times a day (TID) | ORAL | 0 refills | Status: DC | PRN

## 2021-12-26 NOTE — ED Provider Notes (Signed)
[facility]    CSN: [id number] Arrival date & time: 12/26/21  1058      History   Chief Complaint Cough  HPI Erika Salazar is a 41 y.o. female.   HPI  Cough: Patient reports that she has had a cough for the past 3 weeks. Worsening this week. Now feels a burning sensation in her lungs when she takes a deep breath in. She denies fevers, chest pain, vomiting. Has tried multiple OTC medication and early on in course had prednisone and azithromycin. COVID-19 tests negative.   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   Overweight (BMI 25.0-29.9) 05/25/2021   Dyslipidemia 05/25/2021   B12 deficiency 11/06/2017   History of normocytic normochromic anemia 11/06/2017   Vaccine counseling 11/06/2017   Vitamin D insufficiency 11/06/2017   History of cervical dysplasia 03/24/2016   Borderline diabetes mellitus 11/05/2015    Past Surgical History:  Procedure Laterality Date   CERVICAL BIOPSY  W/ LOOP ELECTRODE EXCISION  2014   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 Authorizing Provider  Calcium Carbonate-Vitamin D 600-400 MG-UNIT tablet Take by mouth.    [provider]  cholecalciferol (VITAMIN D) 400 units TABS tablet Take 400 Units by mouth daily.    [provider]  HAILEY 24 FE 1-20 MG-MCG(24) tablet TAKE 1 TABLET BY MOUTH EVERY DAY 09/13/21   Rubie Maid, MD  nitrofurantoin (MACRODANTIN) 50 MG  capsule 1 capsule by mouth after intercourse 11/28/19   Zara Council A, PA-C  predniSONE (STERAPRED UNI-PAK 21 TAB) 10 MG (21) TBPK tablet 6 day taper; take as directed on package instructions 12/07/21   Mar Daring, PA-C  vitamin B-12 (CYANOCOBALAMIN) 100 MCG tablet Take 100 mcg by mouth daily.    [provider]    Family History Family History  Problem Relation Age of Onset   Diabetes Father    Hypertension Father    Diabetes Mother    Hypertension Mother    Cancer Neg Hx    Heart disease Neg Hx    Kidney cancer Neg Hx    Kidney disease Neg Hx    Sickle cell trait Neg Hx    Prostate cancer Neg Hx    Tuberculosis Neg Hx     Social History Social History   Tobacco Use   Smoking status: Never   Smokeless tobacco: Never  Vaping Use   Vaping Use: Never used  Substance Use Topics   Alcohol use: Yes    Comment: Occasional   Drug use: No     Allergies   Patient  has no known allergies.   Review of Systems Review of Systems  As stated above in HPI Physical Exam Triage Vital Signs ED Triage Vitals [12/26/21 1204]  Enc Vitals Group     BP 119/76     Pulse Rate 74     Resp 16     Temp 98.2 F (36.8 C)     Temp Source Oral     SpO2 98 %     Weight      Height      Head Circumference      Peak Flow      Pain Score      Pain Loc      Pain Edu?      Excl. in GC?    No data found.  Updated Vital Signs BP 119/76 (BP Location: Left Arm)    Pulse 74    Temp 98.2 F (36.8 C) (Oral)    Resp 16    SpO2 98%   Physical Exam Vitals and nursing note reviewed.  Constitutional:      General: She is not in acute distress.    Appearance: Normal appearance. She is not ill-appearing, toxic-appearing or diaphoretic.  HENT:     Head: Normocephalic and atraumatic.     Right Ear: Tympanic membrane normal.     Left Ear: Tympanic membrane normal.     Nose: Nose normal.     Mouth/Throat:     Mouth: Mucous membranes are moist.     Pharynx: No  oropharyngeal exudate or posterior oropharyngeal erythema.  Eyes:     Extraocular Movements: Extraocular movements intact.     Pupils: Pupils are equal, round, and reactive to light.  Cardiovascular:     Rate and Rhythm: Normal rate and regular rhythm.     Heart sounds: Normal heart sounds.  Pulmonary:     Effort: Pulmonary effort is normal.     Breath sounds: Rhonchi (Left lung field) present.  Musculoskeletal:     Cervical back: Normal range of motion and neck supple.  Lymphadenopathy:     Cervical: No cervical adenopathy.  Skin:    General: Skin is warm.  Neurological:     Mental Status: She is alert and oriented to person, place, and time.     UC Treatments / Results  Labs (all labs ordered are listed, but only abnormal results are displayed) Labs Reviewed - No data to display  EKG   Radiology DG Chest 2 View  Result Date: 12/26/2021 CLINICAL DATA:  Cough, shortness of breath, wheezing EXAM: CHEST - 2 VIEW COMPARISON:  None. FINDINGS: The heart size and mediastinal contours are within normal limits. Minimal diffuse interstitial opacity. The visualized skeletal structures are unremarkable. IMPRESSION: Minimal diffuse interstitial opacity, which may reflect atypical or viral infection. No focal airspace opacity. Electronically Signed   By: Jearld Lesch M.D.   On: 12/26/2021 12:28    Procedures Procedures (including critical care time)  Medications Ordered in UC Medications - No data to display  Initial Impression / Assessment and Plan / UC Course  I have reviewed the triage vital signs and the nursing notes.  Pertinent labs & imaging results that were available during my care of the patient were reviewed by me and considered in my medical decision making (see chart for details).     New. Symptoms, history and examination consistent with atypical pneumonia. Will prescribe doxycycline, prednisone, albuterol, Flonase, tessalon. Discussed how to use along with  precautions. Follow up PRN.  Final Clinical Impressions(s) / UC Diagnoses   Final diagnoses:  None   Discharge Instructions   None    ED Prescriptions   None    PDMP not reviewed this encounter.   Hughie Closs, [place] 12/26/21 1240

## 2022-01-05 ENCOUNTER — Telehealth: Payer: 59 | Admitting: Physician Assistant

## 2022-01-05 ENCOUNTER — Other Ambulatory Visit: Payer: Self-pay

## 2022-01-05 DIAGNOSIS — B379 Candidiasis, unspecified: Secondary | ICD-10-CM

## 2022-01-05 DIAGNOSIS — T3695XA Adverse effect of unspecified systemic antibiotic, initial encounter: Secondary | ICD-10-CM

## 2022-01-05 MED ORDER — FLUCONAZOLE 150 MG PO TABS
150.0000 mg | ORAL_TABLET | Freq: Once | ORAL | 0 refills | Status: AC
  Filled 2022-01-05: qty 1, 1d supply, fill #0

## 2022-01-05 NOTE — Progress Notes (Signed)

## 2022-01-05 NOTE — Progress Notes (Signed)
I have spent 5 minutes in review of e-visit questionnaire, review and updating patient chart, medical decision making and response to patient.   Makynli Stills Cody Helene Bernstein, PA-C    

## 2022-01-26 DIAGNOSIS — E559 Vitamin D deficiency, unspecified: Secondary | ICD-10-CM | POA: Diagnosis not present

## 2022-01-26 DIAGNOSIS — Z0000 Encounter for general adult medical examination without abnormal findings: Secondary | ICD-10-CM | POA: Diagnosis not present

## 2022-01-26 DIAGNOSIS — R7303 Prediabetes: Secondary | ICD-10-CM | POA: Diagnosis not present

## 2022-01-26 DIAGNOSIS — E538 Deficiency of other specified B group vitamins: Secondary | ICD-10-CM | POA: Diagnosis not present

## 2022-01-26 DIAGNOSIS — Z13220 Encounter for screening for lipoid disorders: Secondary | ICD-10-CM | POA: Diagnosis not present

## 2022-04-17 ENCOUNTER — Telehealth: Payer: Self-pay | Admitting: Family Medicine

## 2022-04-17 MED ORDER — CLOBETASOL PROPIONATE 0.05 % EX SHAM
MEDICATED_SHAMPOO | CUTANEOUS | 0 refills | Status: DC
  Filled 2022-04-17: qty 118, 30d supply, fill #0

## 2022-04-17 NOTE — Telephone Encounter (Signed)
Refilled medicated shampoo. ?

## 2022-04-18 ENCOUNTER — Other Ambulatory Visit: Payer: Self-pay

## 2022-05-23 ENCOUNTER — Telehealth: Payer: Self-pay | Admitting: Family Medicine

## 2022-05-23 NOTE — Telephone Encounter (Signed)
Erroneous encounter

## 2022-05-27 ENCOUNTER — Encounter: Payer: 59 | Admitting: Obstetrics and Gynecology

## 2022-06-17 DIAGNOSIS — E538 Deficiency of other specified B group vitamins: Secondary | ICD-10-CM | POA: Diagnosis not present

## 2022-06-17 DIAGNOSIS — E119 Type 2 diabetes mellitus without complications: Secondary | ICD-10-CM | POA: Diagnosis not present

## 2022-06-17 DIAGNOSIS — E559 Vitamin D deficiency, unspecified: Secondary | ICD-10-CM | POA: Diagnosis not present

## 2022-06-22 DIAGNOSIS — E119 Type 2 diabetes mellitus without complications: Secondary | ICD-10-CM | POA: Diagnosis not present
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
## 2022-07-07 ENCOUNTER — Ambulatory Visit (INDEPENDENT_AMBULATORY_CARE_PROVIDER_SITE_OTHER): Payer: 59 | Admitting: Obstetrics and Gynecology

## 2022-07-07 ENCOUNTER — Encounter: Payer: Self-pay | Admitting: Obstetrics and Gynecology

## 2022-07-07 ENCOUNTER — Other Ambulatory Visit (HOSPITAL_COMMUNITY)
Admission: RE | Admit: 2022-07-07 | Discharge: 2022-07-07 | Disposition: A | Discharge status: Home / Self Care | Payer: 59 | Source: Ambulatory Visit | Attending: Obstetrics and Gynecology | Admitting: Obstetrics and Gynecology

## 2022-07-07 VITALS — BP 106/55 | HR 65 | Resp 16 | Ht 66.0 in | Wt 178.2 lb

## 2022-07-07 DIAGNOSIS — E663 Overweight: Secondary | ICD-10-CM

## 2022-07-07 DIAGNOSIS — Z124 Encounter for screening for malignant neoplasm of cervix: Secondary | ICD-10-CM

## 2022-07-07 DIAGNOSIS — R7303 Prediabetes: Secondary | ICD-10-CM

## 2022-07-07 DIAGNOSIS — N898 Other specified noninflammatory disorders of vagina: Secondary | ICD-10-CM

## 2022-07-07 DIAGNOSIS — Z01411 Encounter for gynecological examination (general) (routine) with abnormal findings: Secondary | ICD-10-CM | POA: Diagnosis not present

## 2022-07-07 DIAGNOSIS — Z1231 Encounter for screening mammogram for malignant neoplasm of breast: Secondary | ICD-10-CM

## 2022-07-07 DIAGNOSIS — Z01419 Encounter for gynecological examination (general) (routine) without abnormal findings: Secondary | ICD-10-CM

## 2022-07-07 DIAGNOSIS — Z3041 Encounter for surveillance of contraceptive pills: Secondary | ICD-10-CM | POA: Diagnosis not present

## 2022-07-07 DIAGNOSIS — Z6825 Body mass index (BMI) 25.0-25.9, adult: Secondary | ICD-10-CM

## 2022-07-08 LAB — CERVICOVAGINAL ANCILLARY ONLY
Bacterial Vaginitis (gardnerella): NEGATIVE
Candida Glabrata: NEGATIVE
Candida Vaginitis: NEGATIVE
Comment: NEGATIVE
Comment: NEGATIVE
Comment: NEGATIVE
Comment: NEGATIVE
Trichomonas: NEGATIVE

## 2022-07-08 LAB — CYTOLOGY - PAP
Adequacy: ABSENT
Comment: NEGATIVE
Diagnosis: NEGATIVE
High risk HPV: NEGATIVE

## 2022-07-18 ENCOUNTER — Other Ambulatory Visit: Payer: Self-pay

## 2022-07-18 MED FILL — Norethindrone Ace-Ethinyl Estradiol-FE Tab 1 MG-20 MCG (24): ORAL | 84 days supply | Qty: 84 | Fill #0 | Status: AC

## 2022-08-19 ENCOUNTER — Ambulatory Visit
Admission: RE | Admit: 2022-08-19 | Discharge: 2022-08-19 | Disposition: A | Discharge status: Home / Self Care | Payer: 59 | Source: Ambulatory Visit | Attending: Obstetrics and Gynecology | Admitting: Obstetrics and Gynecology

## 2022-08-19 DIAGNOSIS — Z1231 Encounter for screening mammogram for malignant neoplasm of breast: Secondary | ICD-10-CM | POA: Insufficient documentation

## 2022-08-26 DIAGNOSIS — H5213 Myopia, bilateral: Secondary | ICD-10-CM | POA: Diagnosis not present

## 2023-01-25 DIAGNOSIS — E119 Type 2 diabetes mellitus without complications: Secondary | ICD-10-CM | POA: Diagnosis not present

## 2023-01-31 ENCOUNTER — Other Ambulatory Visit: Payer: Self-pay

## 2023-01-31 DIAGNOSIS — A084 Viral intestinal infection, unspecified: Secondary | ICD-10-CM | POA: Diagnosis not present

## 2023-01-31 DIAGNOSIS — E7800 Pure hypercholesterolemia, unspecified: Secondary | ICD-10-CM | POA: Diagnosis not present

## 2023-01-31 DIAGNOSIS — E1169 Type 2 diabetes mellitus with other specified complication: Secondary | ICD-10-CM | POA: Diagnosis not present

## 2023-01-31 DIAGNOSIS — Z0000 Encounter for general adult medical examination without abnormal findings: Secondary | ICD-10-CM | POA: Diagnosis not present

## 2023-01-31 DIAGNOSIS — E785 Hyperlipidemia, unspecified: Secondary | ICD-10-CM | POA: Diagnosis not present

## 2023-01-31 MED ORDER — ONDANSETRON 4 MG PO TBDP
4.0000 mg | ORAL_TABLET | Freq: Three times a day (TID) | ORAL | 0 refills | Status: AC | PRN
  Filled 2023-01-31: qty 30, 10d supply, fill #0

## 2023-01-31 MED ORDER — METFORMIN HCL ER 500 MG PO TB24
500.0000 mg | ORAL_TABLET | Freq: Every day | ORAL | 3 refills | Status: DC
  Filled 2023-01-31: qty 30, 30d supply, fill #0
  Filled 2023-03-07: qty 30, 30d supply, fill #1
  Filled 2023-04-10: qty 30, 30d supply, fill #2
  Filled 2023-05-16: qty 30, 30d supply, fill #3

## 2023-01-31 MED ORDER — PRECISION QID TEST VI STRP
1.0000 | ORAL_STRIP | Freq: Every day | 1 refills | Status: AC
  Filled 2023-01-31: qty 100, 90d supply, fill #0

## 2023-01-31 MED ORDER — FREESTYLE FREEDOM LITE W/DEVICE KIT
PACK | 0 refills | Status: AC
Start: 2023-01-31 — End: ?
  Filled 2023-01-31: qty 1, 30d supply, fill #0

## 2023-01-31 MED ORDER — FREESTYLE LANCETS MISC
1.0000 | Freq: Every day | 1 refills | Status: AC
  Filled 2023-01-31: qty 100, 90d supply, fill #0

## 2023-01-31 MED ORDER — LISINOPRIL 5 MG PO TABS
5.0000 mg | ORAL_TABLET | Freq: Every day | ORAL | 3 refills | Status: DC
  Filled 2023-01-31: qty 30, 30d supply, fill #0
  Filled 2023-03-07: qty 30, 30d supply, fill #1

## 2023-02-15 ENCOUNTER — Other Ambulatory Visit: Payer: Self-pay

## 2023-02-15 MED ORDER — HAILEY 24 FE 1-20 MG-MCG(24) PO TABS: ORAL_TABLET | ORAL | 0 refills | Status: DC

## 2023-02-15 NOTE — Addendum Note (Signed)
Addended by: Drenda Freeze on: 02/15/2023 02:31 PM   Modules accepted: Orders

## 2023-03-07 ENCOUNTER — Other Ambulatory Visit: Payer: Self-pay

## 2023-04-10 ENCOUNTER — Other Ambulatory Visit: Payer: Self-pay

## 2023-04-25 DIAGNOSIS — E1169 Type 2 diabetes mellitus with other specified complication: Secondary | ICD-10-CM | POA: Diagnosis not present

## 2023-04-25 DIAGNOSIS — E7800 Pure hypercholesterolemia, unspecified: Secondary | ICD-10-CM | POA: Diagnosis not present

## 2023-04-25 DIAGNOSIS — E785 Hyperlipidemia, unspecified: Secondary | ICD-10-CM | POA: Diagnosis not present

## 2023-05-02 DIAGNOSIS — E785 Hyperlipidemia, unspecified: Secondary | ICD-10-CM | POA: Diagnosis not present

## 2023-05-02 DIAGNOSIS — E559 Vitamin D deficiency, unspecified: Secondary | ICD-10-CM | POA: Diagnosis not present

## 2023-05-02 DIAGNOSIS — E7800 Pure hypercholesterolemia, unspecified: Secondary | ICD-10-CM | POA: Diagnosis not present

## 2023-05-02 DIAGNOSIS — E1169 Type 2 diabetes mellitus with other specified complication: Secondary | ICD-10-CM | POA: Diagnosis not present

## 2023-05-02 DIAGNOSIS — E538 Deficiency of other specified B group vitamins: Secondary | ICD-10-CM | POA: Diagnosis not present

## 2023-05-04 ENCOUNTER — Telehealth: Payer: Commercial Managed Care - PPO | Admitting: Physician Assistant

## 2023-05-04 DIAGNOSIS — B9689 Other specified bacterial agents as the cause of diseases classified elsewhere: Secondary | ICD-10-CM | POA: Diagnosis not present

## 2023-05-04 DIAGNOSIS — J208 Acute bronchitis due to other specified organisms: Secondary | ICD-10-CM | POA: Diagnosis not present

## 2023-05-04 MED ORDER — AZITHROMYCIN 250 MG PO TABS
ORAL_TABLET | ORAL | 0 refills | Status: AC
Start: 2023-05-04 — End: 2023-05-10
  Filled 2023-05-04: qty 6, 5d supply, fill #0

## 2023-05-04 MED ORDER — BENZONATATE 100 MG PO CAPS
100.0000 mg | ORAL_CAPSULE | Freq: Three times a day (TID) | ORAL | 0 refills | Status: DC | PRN
Start: 2023-05-04 — End: 2023-07-12
  Filled 2023-05-04: qty 30, 10d supply, fill #0

## 2023-05-04 MED ORDER — PREDNISONE 10 MG PO TABS
ORAL_TABLET | ORAL | 0 refills | Status: DC
  Filled 2023-05-04: qty 21, 6d supply, fill #0

## 2023-05-04 NOTE — Progress Notes (Signed)
E-Visit for Cough  We are sorry that you are not feeling well.  Here is how we plan to help!  Based on your presentation I believe you most likely have A cough due to bacteria.  When patients have a fever and a productive cough with a change in color or increased sputum production, we are concerned about bacterial bronchitis.  If left untreated it can progress to pneumonia.  If your symptoms do not improve with your treatment plan it is important that you contact your provider.   I have prescribed Azithromyin 250 mg: two tablets now and then one tablet daily for 4 additonal days    In addition you may use A prescription cough medication called Tessalon Perles 100mg . You may take 1-2 capsules every 8 hours as needed for your cough.  Prednisone 10 mg daily for 6 days (see taper instructions below)  Directions for 6 day taper: Day 1: 2 tablets before breakfast, 1 after both lunch & dinner and 2 at bedtime Day 2: 1 tab before breakfast, 1 after both lunch & dinner and 2 at bedtime Day 3: 1 tab at each meal & 1 at bedtime Day 4: 1 tab at breakfast, 1 at lunch, 1 at bedtime Day 5: 1 tab at breakfast & 1 tab at bedtime Day 6: 1 tab at breakfast  From your responses in the eVisit questionnaire you describe inflammation in the upper respiratory tract which is causing a significant cough.  This is commonly called Bronchitis and has four common causes:   Allergies Viral Infections Acid Reflux Bacterial Infection Allergies, viruses and acid reflux are treated by controlling symptoms or eliminating the cause. An example might be a cough caused by taking certain blood pressure medications. You stop the cough by changing the medication. Another example might be a cough caused by acid reflux. Controlling the reflux helps control the cough.  USE OF BRONCHODILATOR ("RESCUE") INHALERS: There is a risk from using your bronchodilator too frequently.  The risk is that over-reliance on a medication which only  relaxes the muscles surrounding the breathing tubes can reduce the effectiveness of medications prescribed to reduce swelling and congestion of the tubes themselves.  Although you feel brief relief from the bronchodilator inhaler, your asthma may actually be worsening with the tubes becoming more swollen and filled with mucus.  This can delay other crucial treatments, such as oral steroid medications. If you need to use a bronchodilator inhaler daily, several times per day, you should discuss this with your provider.  There are probably better treatments that could be used to keep your asthma under control.     HOME CARE Only take medications as instructed by your medical team. Complete the entire course of an antibiotic. Drink plenty of fluids and get plenty of rest. Avoid close contacts especially the very young and the elderly Cover your mouth if you cough or cough into your sleeve. Always remember to wash your hands A steam or ultrasonic humidifier can help congestion.   GET HELP RIGHT AWAY IF: You develop worsening fever. You become short of breath You cough up blood. Your symptoms persist after you have completed your treatment plan MAKE SURE YOU  Understand these instructions. Will watch your condition. Will get help right away if you are not doing well or get worse.    Thank you for choosing an e-visit.  Your e-visit answers were reviewed by a board certified advanced clinical practitioner to complete your personal care plan. Depending upon the  condition, your plan could have included both over the counter or prescription medications.  Please review your pharmacy choice. Make sure the pharmacy is open so you can pick up prescription now. If there is a problem, you may contact your provider through [employer] and have the prescription routed to another pharmacy.  Your safety is important to [place]. If you have drug allergies check your prescription carefully.   For the next 24  hours you can use MyChart to ask questions about today's visit, request a non-urgent call back, or ask for a work or school excuse. You will get an email in the next two days asking about your experience. I hope that your e-visit has been valuable and will speed your recovery.  I have spent 5 minutes in review of e-visit questionnaire, review and updating patient chart, medical decision making and response to patient.   Margaretann Loveless, PA-C

## 2023-05-05 ENCOUNTER — Other Ambulatory Visit: Payer: Self-pay

## 2023-05-09 ENCOUNTER — Other Ambulatory Visit: Payer: Self-pay

## 2023-05-09 MED ORDER — HYDROCODONE-ACETAMINOPHEN 7.5-325 MG PO TABS
1.0000 | ORAL_TABLET | ORAL | 0 refills | Status: DC | PRN
  Filled 2023-05-09: qty 10, 2d supply, fill #0

## 2023-05-09 MED ORDER — IBUPROFEN 600 MG PO TABS
600.0000 mg | ORAL_TABLET | Freq: Four times a day (QID) | ORAL | 1 refills | Status: DC
  Filled 2023-05-09: qty 20, 5d supply, fill #0
  Filled 2023-05-16: qty 20, 5d supply, fill #1

## 2023-05-16 ENCOUNTER — Other Ambulatory Visit: Payer: Self-pay

## 2023-05-25 ENCOUNTER — Other Ambulatory Visit: Payer: Self-pay | Admitting: Obstetrics and Gynecology

## 2023-05-26 MED ORDER — HAILEY 24 FE 1-20 MG-MCG(24) PO TABS: ORAL_TABLET | ORAL | 0 refills | Status: DC

## 2023-06-19 ENCOUNTER — Other Ambulatory Visit: Payer: Self-pay

## 2023-06-19 MED ORDER — METFORMIN HCL ER 500 MG PO TB24
500.0000 mg | ORAL_TABLET | Freq: Every day | ORAL | 3 refills | Status: DC
  Filled 2023-06-19: qty 30, 30d supply, fill #0
  Filled 2023-08-07: qty 90, 90d supply, fill #1
  Filled 2023-08-08: qty 30, 30d supply, fill #1
  Filled 2023-09-11: qty 30, 30d supply, fill #2
  Filled 2023-10-12: qty 30, 30d supply, fill #3

## 2023-07-11 NOTE — Progress Notes (Unsigned)
GYNECOLOGY ANNUAL PHYSICAL EXAM PROGRESS NOTE  Subjective:    Erika Salazar is a 42 y.o. G16P3003 female who presents for an annual exam. The patient has no complaints today. The patient {is/is not/has never been:13135} sexually active. The patient participates in regular exercise: {yes/no/not asked:9010}. Has the patient ever been transfused or tattooed?: {yes/no/not asked:9010}. The patient reports that there {is/is not:9024} domestic violence in her life.    Menstrual History: Menarche age: 75 No LMP recorded. (Menstrual status: Oral contraceptives).    Gynecologic History:  Contraception: OCP (estrogen/progesterone) History of STI's: Denies Last Pap: 07/07/2022. Results were: normal.  Patient reports history of abnormal pap exam 12 years ago  Last mammogram: 08/19/2022. Results were: normal   OB History  Gravida Para Term Preterm AB Living  3 3 3  0 0 3  SAB IAB Ectopic Multiple Live Births  0 0 0 0 3    # Outcome Date GA Lbr Len/2nd Weight Sex Type Anes PTL Lv  3 Term 2013   7 lb 8 oz (3.402 kg) M Vag-Spont   LIV  2 Term 2003   5 lb 9.6 oz (2.54 kg) M Vag-Spont   LIV  1 Term 1999   7 lb 9.6 oz (3.447 kg) F Vag-Spont   LIV    Past Medical History:  Diagnosis Date   Amenorrhea    ASCUS with positive high risk HPV    BV (bacterial vaginosis)    Elevated glucose    Severe dysplasia of cervix    Vaginal cyst     Past Surgical History:  Procedure Laterality Date   CERVICAL BIOPSY  W/ LOOP ELECTRODE EXCISION  2014   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- POST LEEP ECC- 05/2014 NEG    Family History  Problem Relation Age of Onset   Diabetes Father    Hypertension Father    Diabetes Mother    Hypertension Mother    Cancer Neg Hx    Heart disease Neg Hx    Kidney cancer Neg Hx    Kidney disease Neg Hx    Sickle cell trait Neg Hx    Prostate cancer Neg Hx    Tuberculosis Neg Hx     Social History   Socioeconomic History   Marital status: Married    Spouse  name: Not on file   Number of children: Not on file   Years of education: Not on file   Highest education level: Not on file  Occupational History   Not on file  Tobacco Use   Smoking status: Never   Smokeless tobacco: Never  Vaping Use   Vaping status: Never Used  Substance and Sexual Activity   Alcohol use: Yes    Comment: Occasional   Drug use: No   Sexual activity: Yes    Birth control/protection: None, Pill  Other Topics Concern   Not on file  Social History Narrative   Not on file   Social Determinants of Health   Financial Resource Strain: Not on file  Food Insecurity: Not on file  Transportation Needs: Not on file  Physical Activity: Insufficiently Active (05/24/2018)   Exercise Vital Sign    Days of Exercise per Week: 2 days    Minutes of Exercise per Session: 30 min  Stress: Not on file  Social Connections: Not on file  Intimate Partner Violence: Not on file    Current Outpatient Medications on File Prior to Visit  Medication Sig Dispense Refill   benzonatate (TESSALON) 100  MG capsule Take 1 capsule (100 mg total) by mouth 3 (three) times daily as needed. 30 capsule 0   Blood Glucose Monitoring Suppl (FREESTYLE FREEDOM LITE) w/Device KIT use as directed 1 kit 0   Calcium Carbonate-Vitamin D 600-400 MG-UNIT tablet Take by mouth.     glucose blood (PRECISION QID TEST) test strip Check fasting blood sugar once daily. 100 each 1   HYDROcodone-acetaminophen (NORCO) 7.5-325 MG tablet Take 1 tablet by mouth every 4 (four) to 6 (six) hours as needed for pain. 10 tablet 0   ibuprofen (ADVIL) 600 MG tablet Take 1 tablet (600 mg total) by mouth every 6 (six) hours for 5 days after surgery. 20 tablet 1   Lancets (FREESTYLE) lancets Check blood sugar fasting once daily. 100 each 1   metFORMIN (GLUCOPHAGE-XR) 500 MG 24 hr tablet Take 1 tablet (500 mg total) by mouth daily with supper. 30 tablet 3   Norethindrone Acetate-Ethinyl Estrad-FE (HAILEY 24 FE) 1-20 MG-MCG(24) tablet  TAKE 1 TABLET BY MOUTH EVERY DAY 84 tablet 0   ondansetron (ZOFRAN-ODT) 4 MG disintegrating tablet Take 1 tablet (4 mg total) by mouth every 8 (eight) hours as needed for Nausea or Vomiting 30 tablet 0   predniSONE (DELTASONE) 10 MG tablet Take 6 tabs on day 1, 5 tabs on day 2, 4 tabs on day 3, 3 tabs on day 4, 2 tabs on day 5, 1 tab on day 6. Then stop. 21 tablet 0   vitamin B-12 (CYANOCOBALAMIN) 100 MCG tablet Take 100 mcg by mouth daily.     [DISCONTINUED] lisinopril (ZESTRIL) 5 MG tablet Take 1 tablet (5 mg total) by mouth daily. 30 tablet 3   No current facility-administered medications on file prior to visit.    No Known Allergies   Review of Systems Constitutional: negative for chills, fatigue, fevers and sweats Eyes: negative for irritation, redness and visual disturbance Ears, nose, mouth, throat, and face: negative for hearing loss, nasal congestion, snoring and tinnitus Respiratory: negative for asthma, cough, sputum Cardiovascular: negative for chest pain, dyspnea, exertional chest pressure/discomfort, irregular heart beat, palpitations and syncope Gastrointestinal: negative for abdominal pain, change in bowel habits, nausea and vomiting Genitourinary: negative for abnormal menstrual periods, genital lesions, sexual problems and vaginal discharge, dysuria and urinary incontinence Integument/breast: negative for breast lump, breast tenderness and nipple discharge Hematologic/lymphatic: negative for bleeding and easy bruising Musculoskeletal:negative for back pain and muscle weakness Neurological: negative for dizziness, headaches, vertigo and weakness Endocrine: negative for diabetic symptoms including polydipsia, polyuria and skin dryness Allergic/Immunologic: negative for hay fever and urticaria      Objective:  There were no vitals taken for this visit. There is no height or weight on file to calculate BMI.    General Appearance:    Alert, cooperative, no distress,  appears stated age  Head:    Normocephalic, without obvious abnormality, atraumatic  Eyes:    PERRL, conjunctiva/corneas clear, EOM's intact, both eyes  Ears:    Normal external ear canals, both ears  Nose:   Nares normal, septum midline, mucosa normal, no drainage or sinus tenderness  Throat:   Lips, mucosa, and tongue normal; teeth and gums normal  Neck:   Supple, symmetrical, trachea midline, no adenopathy; thyroid: no enlargement/tenderness/nodules; no carotid bruit or JVD  Back:     Symmetric, no curvature, ROM normal, no CVA tenderness  Lungs:     Clear to auscultation bilaterally, respirations unlabored  Chest Wall:    No tenderness or deformity   Heart:  Regular rate and rhythm, S1 and S2 normal, no murmur, rub or gallop  Breast Exam:    No tenderness, masses, or nipple abnormality  Abdomen:     Soft, non-tender, bowel sounds active all four quadrants, no masses, no organomegaly.    Genitalia:    Pelvic:external genitalia normal, vagina without lesions, discharge, or tenderness, rectovaginal septum  normal. Cervix normal in appearance, no cervical motion tenderness, no adnexal masses or tenderness.  Uterus normal size, shape, mobile, regular contours, nontender.  Rectal:    Normal external sphincter.  No hemorrhoids appreciated. Internal exam not done.   Extremities:   Extremities normal, atraumatic, no cyanosis or edema  Pulses:   2+ and symmetric all extremities  Skin:   Skin color, texture, turgor normal, no rashes or lesions  Lymph nodes:   Cervical, supraclavicular, and axillary nodes normal  Neurologic:   CNII-XII intact, normal strength, sensation and reflexes throughout   .  Labs:  Lab Results  Component Value Date   WBC 12.3 (H) 10/09/2012   HGB 10.5 (L) 10/09/2012   HCT 29.2 (L) 10/10/2012   MCV 80 10/09/2012   PLT 189 10/09/2012    Lab Results  Component Value Date   CREATININE 0.90 05/06/2019   BUN 12 05/06/2019    No results found for: "ALT", "AST",  "GGT", "ALKPHOS", "BILITOT"  Lab Results  Component Value Date   TSH 0.940 06/04/2019     Assessment:   1. Well woman exam with routine gynecological exam   2. Prediabetes   3. Surveillance for birth control, oral contraceptives   4. Screening cholesterol level      Plan:  Blood tests: Ordered today. Breast self exam technique reviewed and patient encouraged to perform self-exam monthly. Contraception: OCP (estrogen/progesterone). Discussed healthy lifestyle modifications. Mammogram ordered Pap smear  UTD . COVID vaccination status: Follow up in 1 year for annual exam   Hildred Laser, MD [facility]

## 2023-07-12 ENCOUNTER — Other Ambulatory Visit: Payer: Self-pay

## 2023-07-12 ENCOUNTER — Encounter: Payer: Self-pay | Admitting: Obstetrics and Gynecology

## 2023-07-12 ENCOUNTER — Ambulatory Visit (INDEPENDENT_AMBULATORY_CARE_PROVIDER_SITE_OTHER): Payer: Commercial Managed Care - PPO | Admitting: Obstetrics and Gynecology

## 2023-07-12 VITALS — BP 103/72 | HR 82 | Resp 16 | Ht 66.0 in | Wt 173.4 lb

## 2023-07-12 DIAGNOSIS — Z13220 Encounter for screening for lipoid disorders: Secondary | ICD-10-CM

## 2023-07-12 DIAGNOSIS — Z1231 Encounter for screening mammogram for malignant neoplasm of breast: Secondary | ICD-10-CM

## 2023-07-12 DIAGNOSIS — R7303 Prediabetes: Secondary | ICD-10-CM

## 2023-07-12 DIAGNOSIS — Z1159 Encounter for screening for other viral diseases: Secondary | ICD-10-CM

## 2023-07-12 DIAGNOSIS — Z114 Encounter for screening for human immunodeficiency virus [HIV]: Secondary | ICD-10-CM | POA: Diagnosis not present

## 2023-07-12 DIAGNOSIS — E119 Type 2 diabetes mellitus without complications: Secondary | ICD-10-CM

## 2023-07-12 DIAGNOSIS — E663 Overweight: Secondary | ICD-10-CM

## 2023-07-12 DIAGNOSIS — E785 Hyperlipidemia, unspecified: Secondary | ICD-10-CM

## 2023-07-12 DIAGNOSIS — Z01419 Encounter for gynecological examination (general) (routine) without abnormal findings: Secondary | ICD-10-CM

## 2023-07-12 DIAGNOSIS — Z3041 Encounter for surveillance of contraceptive pills: Secondary | ICD-10-CM

## 2023-07-12 MED ORDER — HAILEY 24 FE 1-20 MG-MCG(24) PO TABS
1.0000 | ORAL_TABLET | Freq: Every day | ORAL | 3 refills | Status: AC
Start: 2023-07-12 — End: ?
  Filled 2023-07-12 – 2023-08-07 (×2): qty 112, 84d supply, fill #0
  Filled 2023-08-10: qty 84, 84d supply, fill #0
  Filled 2023-11-08: qty 84, 84d supply, fill #1
  Filled 2024-02-16: qty 84, 84d supply, fill #2
  Filled 2024-05-16: qty 84, 84d supply, fill #3

## 2023-07-12 NOTE — Patient Instructions (Addendum)
Preventive Care 40-44 Years Old, Female Preventive care refers to lifestyle choices and visits with your health care provider that can promote health and wellness. Preventive care visits are also called wellness exams. What can I expect for my preventive care visit? Counseling Your health care provider may ask you questions about your: Medical history, including: Past medical problems. Family medical history. Pregnancy history. Current health, including: Menstrual cycle. Method of birth control. Emotional well-being. Home life and relationship well-being. Sexual activity and sexual health. Lifestyle, including: Alcohol, nicotine or tobacco, and drug use. Access to firearms. Diet, exercise, and sleep habits. Work and work environment. Sunscreen use. Safety issues such as seatbelt and bike helmet use. Physical exam Your health care provider will check your: Height and weight. These may be used to calculate your BMI (body mass index). BMI is a measurement that tells if you are at a healthy weight. Waist circumference. This measures the distance around your waistline. This measurement also tells if you are at a healthy weight and may help predict your risk of certain diseases, such as type 2 diabetes and high blood pressure. Heart rate and blood pressure. Body temperature. Skin for abnormal spots. What immunizations do I need?  Vaccines are usually given at various ages, according to a schedule. Your health care provider will recommend vaccines for you based on your age, medical history, and lifestyle or other factors, such as travel or where you work. What tests do I need? Screening Your health care provider may recommend screening tests for certain conditions. This may include: Lipid and cholesterol levels. Diabetes screening. This is done by checking your blood sugar (glucose) after you have not eaten for a while (fasting). Pelvic exam and Pap test. Hepatitis B test. Hepatitis C  test. HIV (human immunodeficiency virus) test. STI (sexually transmitted infection) testing, if you are at risk. Lung cancer screening. Colorectal cancer screening. Mammogram. Talk with your health care provider about when you should start having regular mammograms. This may depend on whether you have a family history of breast cancer. BRCA-related cancer screening. This may be done if you have a family history of breast, ovarian, tubal, or peritoneal cancers. Bone density scan. This is done to screen for osteoporosis. Talk with your health care provider about your test results, treatment options, and if necessary, the need for more tests. Follow these instructions at home: Eating and drinking  Eat a diet that includes fresh fruits and vegetables, whole grains, lean protein, and low-fat dairy products. Take vitamin and mineral supplements as recommended by your health care provider. Do not drink alcohol if: Your health care provider tells you not to drink. You are pregnant, may be pregnant, or are planning to become pregnant. If you drink alcohol: Limit how much you have to 0-1 drink a day. Know how much alcohol is in your drink. In the [place] one drink equals one 12 oz bottle of beer (355 mL), one 5 oz glass of wine (148 mL), or one 1 oz glass of hard liquor (44 mL). Lifestyle Brush your teeth every morning and night with fluoride toothpaste. Floss one time each day. Exercise for at least 30 minutes 5 or more days each week. Do not use any products that contain nicotine or tobacco. These products include cigarettes, chewing tobacco, and vaping devices, such as e-cigarettes. If you need help quitting, ask your health care provider. Do not use drugs. If you are sexually active, practice safe sex. Use a condom or other form of protection to   prevent STIs. If you do not wish to become pregnant, use a form of birth control. If you plan to become pregnant, see your health care provider for a  prepregnancy visit. Take aspirin only as told by your health care provider. Make sure that you understand how much to take and what form to take. Work with your health care provider to find out whether it is safe and beneficial for you to take aspirin daily. Find healthy ways to manage stress, such as: Meditation, yoga, or listening to music. Journaling. Talking to a trusted person. Spending time with friends and family. Minimize exposure to UV radiation to reduce your risk of skin cancer. Safety Always wear your seat belt while driving or riding in a vehicle. Do not drive: If you have been drinking alcohol. Do not ride with someone who has been drinking. When you are tired or distracted. While texting. If you have been using any mind-altering substances or drugs. Wear a helmet and other protective equipment during sports activities. If you have firearms in your house, make sure you follow all gun safety procedures. Seek help if you have been physically or sexually abused. What's next? Visit your health care provider once a year for an annual wellness visit. Ask your health care provider how often you should have your eyes and teeth checked. Stay up to date on all vaccines. This information is not intended to replace advice given to you by your health care provider. Make sure you discuss any questions you have with your health care provider. Document Revised: 06/02/2021 Document Reviewed: 06/02/2021 Elsevier Patient Education  2024 Elsevier Inc. Breast Self-Awareness Breast self-awareness is knowing how your breasts look and feel. You need to: Check your breasts on a regular basis. Tell your doctor about any changes. Become familiar with the look and feel of your breasts. This can help you catch a breast problem while it is still small and can be treated. You should do breast self-exams even if you have breast implants. What you need: A mirror. A well-lit room. A pillow or other  soft object. How to do a breast self-exam Follow these steps to do a breast self-exam: Look for changes  Take off all the clothes above your waist. Stand in front of a mirror in a room with good lighting. Put your hands down at your sides. Compare your breasts in the mirror. Look for any difference between them, such as: A difference in shape. A difference in size. Wrinkles, dips, and bumps in one breast and not the other. Look at each breast for changes in the skin, such as: Redness. Scaly areas. Skin that has gotten thicker. Dimpling. Open sores (ulcers). Look for changes in your nipples, such as: Fluid coming out of a nipple. Fluid around a nipple. Bleeding. Dimpling. Redness. A nipple that looks pushed in (retracted), or that has changed position. Feel for changes Lie on your back. Feel each breast. To do this: Pick a breast to feel. Place a pillow under the shoulder closest to that breast. Put the arm closest to that breast behind your head. Feel the nipple area of that breast using the hand of your other arm. Feel the area with the pads of your three middle fingers by making small circles with your fingers. Use light, medium, and firm pressure. Continue the overlapping circles, moving downward over the breast. Keep making circles with your fingers. Stop when you feel your ribs. Start making circles with your fingers again, this time going   upward until you reach your collarbone. Then, make circles outward across your breast and into your armpit area. Squeeze your nipple. Check for discharge and lumps. Repeat these steps to check your other breast. Sit or stand in the tub or shower. With soapy water on your skin, feel each breast the same way you did when you were lying down. Write down what you find Writing down what you find can help you remember what to tell your doctor. Write down: What is normal for each breast. Any changes you find in each breast. These  include: The kind of changes you find. A tender or painful breast. Any lump you find. Write down its size and where it is. When you last had your monthly period (menstrual cycle). General tips If you are breastfeeding, the best time to check your breasts is after you feed your baby or after you use a breast pump. If you get monthly bleeding, the best time to check your breasts is 5-7 days after your monthly cycle ends. With time, you will become comfortable with the self-exam. You will also start to know if there are changes in your breasts. Contact a doctor if: You see a change in the shape or size of your breasts or nipples. You see a change in the skin of your breast or nipples, such as red or scaly skin. You have fluid coming from your nipples that is not normal. You find a new lump or thick area. You have breast pain. You have any concerns about your breast health. Summary Breast self-awareness includes looking for changes in your breasts and feeling for changes within your breasts. You should do breast self-awareness in front of a mirror in a well-lit room. If you get monthly periods (menstrual cycles), the best time to check your breasts is 5-7 days after your period ends. Tell your doctor about any changes you see in your breasts. Changes include changes in size, changes on the skin, painful or tender breasts, or fluid from your nipples that is not normal. This information is not intended to replace advice given to you by your health care provider. Make sure you discuss any questions you have with your health care provider. Document Revised: 05/12/2022 Document Reviewed: 10/07/2021 Elsevier Patient Education  2024 Elsevier Inc.  

## 2023-07-13 LAB — HIV ANTIBODY (ROUTINE TESTING W REFLEX): HIV Screen 4th Generation wRfx: NONREACTIVE

## 2023-08-07 ENCOUNTER — Other Ambulatory Visit: Payer: Self-pay

## 2023-08-08 ENCOUNTER — Other Ambulatory Visit: Payer: Self-pay

## 2023-08-10 ENCOUNTER — Other Ambulatory Visit: Payer: Self-pay

## 2023-08-23 ENCOUNTER — Ambulatory Visit
Admission: RE | Admit: 2023-08-23 | Discharge: 2023-08-23 | Disposition: A | Discharge status: Home / Self Care | Payer: Commercial Managed Care - PPO | Source: Ambulatory Visit | Attending: Obstetrics and Gynecology | Admitting: Obstetrics and Gynecology

## 2023-08-23 ENCOUNTER — Other Ambulatory Visit: Payer: Self-pay

## 2023-08-23 DIAGNOSIS — Z01419 Encounter for gynecological examination (general) (routine) without abnormal findings: Secondary | ICD-10-CM

## 2023-08-23 DIAGNOSIS — Z1231 Encounter for screening mammogram for malignant neoplasm of breast: Secondary | ICD-10-CM | POA: Insufficient documentation

## 2023-08-23 DIAGNOSIS — Z3041 Encounter for surveillance of contraceptive pills: Secondary | ICD-10-CM

## 2023-09-11 ENCOUNTER — Other Ambulatory Visit: Payer: Self-pay

## 2023-10-02 ENCOUNTER — Other Ambulatory Visit: Payer: Self-pay | Admitting: Medical Genetics

## 2023-10-02 DIAGNOSIS — Z006 Encounter for examination for normal comparison and control in clinical research program: Secondary | ICD-10-CM

## 2023-10-12 ENCOUNTER — Other Ambulatory Visit: Payer: Self-pay

## 2023-10-13 ENCOUNTER — Telehealth: Payer: Commercial Managed Care - PPO | Admitting: Physician Assistant

## 2023-10-13 ENCOUNTER — Other Ambulatory Visit: Payer: Self-pay

## 2023-10-13 DIAGNOSIS — B9689 Other specified bacterial agents as the cause of diseases classified elsewhere: Secondary | ICD-10-CM | POA: Diagnosis not present

## 2023-10-13 DIAGNOSIS — J019 Acute sinusitis, unspecified: Secondary | ICD-10-CM | POA: Diagnosis not present

## 2023-10-13 DIAGNOSIS — R051 Acute cough: Secondary | ICD-10-CM | POA: Diagnosis not present

## 2023-10-13 MED ORDER — AMOXICILLIN-POT CLAVULANATE 875-125 MG PO TABS
1.0000 | ORAL_TABLET | Freq: Two times a day (BID) | ORAL | 0 refills | Status: DC
Start: 2023-10-13 — End: 2024-03-18
  Filled 2023-10-13: qty 14, 7d supply, fill #0

## 2023-10-13 MED ORDER — BENZONATATE 100 MG PO CAPS
100.0000 mg | ORAL_CAPSULE | Freq: Three times a day (TID) | ORAL | 0 refills | Status: DC | PRN
Start: 2023-10-13 — End: 2024-03-18
  Filled 2023-10-13: qty 30, 5d supply, fill #0

## 2023-10-13 NOTE — Progress Notes (Signed)
E-Visit for Sinus Problems  We are sorry that you are not feeling well.  Here is how we plan to help!  Based on what you have shared with me it looks like you have sinusitis.  Sinusitis is inflammation and infection in the sinus cavities of the head.  Based on your presentation I believe you most likely have Acute Bacterial Sinusitis.  This is an infection caused by bacteria and is treated with antibiotics. I have prescribed Augmentin 875mg /125mg  one tablet twice daily with food, for 7 days. I have also prescribed Tessalon perles for cough. Take 1-2 capsules every 8 hours as needed for cough. This can be used together with OTC cough medications if needed. You may use an oral decongestant such as Mucinex D or if you have glaucoma or high blood pressure use plain Mucinex. Saline nasal spray help and can safely be used as often as needed for congestion.  If you develop worsening sinus pain, fever or notice severe headache and vision changes, or if symptoms are not better after completion of antibiotic, please schedule an appointment with a health care provider.    Sinus infections are not as easily transmitted as other respiratory infection, however we still recommend that you avoid close contact with loved ones, especially the very young and elderly.  Remember to wash your hands thoroughly throughout the day as this is the number one way to prevent the spread of infection!  Home Care: Only take medications as instructed by your medical team. Complete the entire course of an antibiotic. Do not take these medications with alcohol. A steam or ultrasonic humidifier can help congestion.  You can place a towel over your head and breathe in the steam from hot water coming from a faucet. Avoid close contacts especially the very young and the elderly. Cover your mouth when you cough or sneeze. Always remember to wash your hands.  Get Help Right Away If: You develop worsening fever or sinus pain. You develop  a severe head ache or visual changes. Your symptoms persist after you have completed your treatment plan.  Make sure you Understand these instructions. Will watch your condition. Will get help right away if you are not doing well or get worse.  Thank you for choosing an e-visit.  Your e-visit answers were reviewed by a board certified advanced clinical practitioner to complete your personal care plan. Depending upon the condition, your plan could have included both over the counter or prescription medications.  Please review your pharmacy choice. Make sure the pharmacy is open so you can pick up prescription now. If there is a problem, you may contact your provider through [employer] and have the prescription routed to another pharmacy.  Your safety is important to [place]. If you have drug allergies check your prescription carefully.   For the next 24 hours you can use MyChart to ask questions about today's visit, request a non-urgent call back, or ask for a work or school excuse. You will get an email in the next two days asking about your experience. I hope that your e-visit has been valuable and will speed your recovery.  I have spent 5 minutes in review of e-visit questionnaire, review and updating patient chart, medical decision making and response to patient.   Margaretann Loveless, PA-C

## 2023-10-19 ENCOUNTER — Other Ambulatory Visit
Admission: RE | Admit: 2023-10-19 | Discharge: 2023-10-19 | Disposition: A | Discharge status: Home / Self Care | Payer: Commercial Managed Care - PPO | Source: Ambulatory Visit | Attending: Oncology | Admitting: Oncology

## 2023-10-19 DIAGNOSIS — Z006 Encounter for examination for normal comparison and control in clinical research program: Secondary | ICD-10-CM | POA: Insufficient documentation

## 2023-10-23 ENCOUNTER — Telehealth: Payer: Commercial Managed Care - PPO | Admitting: Physician Assistant

## 2023-10-23 ENCOUNTER — Other Ambulatory Visit: Payer: Self-pay

## 2023-10-23 DIAGNOSIS — B379 Candidiasis, unspecified: Secondary | ICD-10-CM | POA: Diagnosis not present

## 2023-10-23 DIAGNOSIS — T3695XA Adverse effect of unspecified systemic antibiotic, initial encounter: Secondary | ICD-10-CM

## 2023-10-23 MED ORDER — FLUCONAZOLE 150 MG PO TABS
150.0000 mg | ORAL_TABLET | ORAL | 0 refills | Status: DC | PRN
  Filled 2023-10-23: qty 2, 6d supply, fill #0

## 2023-10-23 NOTE — Progress Notes (Signed)

## 2023-10-24 DIAGNOSIS — E7800 Pure hypercholesterolemia, unspecified: Secondary | ICD-10-CM | POA: Diagnosis not present

## 2023-10-24 DIAGNOSIS — E1169 Type 2 diabetes mellitus with other specified complication: Secondary | ICD-10-CM | POA: Diagnosis not present

## 2023-10-24 DIAGNOSIS — E559 Vitamin D deficiency, unspecified: Secondary | ICD-10-CM | POA: Diagnosis not present

## 2023-10-24 DIAGNOSIS — E785 Hyperlipidemia, unspecified: Secondary | ICD-10-CM | POA: Diagnosis not present

## 2023-10-24 DIAGNOSIS — E538 Deficiency of other specified B group vitamins: Secondary | ICD-10-CM | POA: Diagnosis not present

## 2023-10-28 LAB — HELIX MOLECULAR SCREEN: Genetic Analysis Overall Interpretation: NEGATIVE

## 2023-10-28 LAB — GENECONNECT MOLECULAR SCREEN

## 2023-10-31 ENCOUNTER — Other Ambulatory Visit: Payer: Self-pay

## 2023-10-31 DIAGNOSIS — E7800 Pure hypercholesterolemia, unspecified: Secondary | ICD-10-CM | POA: Diagnosis not present

## 2023-10-31 DIAGNOSIS — E538 Deficiency of other specified B group vitamins: Secondary | ICD-10-CM | POA: Diagnosis not present

## 2023-10-31 DIAGNOSIS — E785 Hyperlipidemia, unspecified: Secondary | ICD-10-CM | POA: Diagnosis not present

## 2023-10-31 DIAGNOSIS — E1169 Type 2 diabetes mellitus with other specified complication: Secondary | ICD-10-CM | POA: Diagnosis not present

## 2023-10-31 DIAGNOSIS — E559 Vitamin D deficiency, unspecified: Secondary | ICD-10-CM | POA: Diagnosis not present

## 2023-10-31 MED ORDER — METFORMIN HCL ER 500 MG PO TB24
1000.0000 mg | ORAL_TABLET | Freq: Every day | ORAL | 1 refills | Status: AC
  Filled 2023-10-31 – 2023-11-08 (×2): qty 180, 90d supply, fill #0
  Filled 2024-02-16: qty 180, 90d supply, fill #1

## 2023-11-08 ENCOUNTER — Other Ambulatory Visit: Payer: Self-pay

## 2024-02-13 DIAGNOSIS — E7800 Pure hypercholesterolemia, unspecified: Secondary | ICD-10-CM | POA: Diagnosis not present

## 2024-02-13 DIAGNOSIS — E1169 Type 2 diabetes mellitus with other specified complication: Secondary | ICD-10-CM | POA: Diagnosis not present

## 2024-02-13 DIAGNOSIS — E538 Deficiency of other specified B group vitamins: Secondary | ICD-10-CM | POA: Diagnosis not present

## 2024-02-13 DIAGNOSIS — E785 Hyperlipidemia, unspecified: Secondary | ICD-10-CM | POA: Diagnosis not present

## 2024-02-16 ENCOUNTER — Other Ambulatory Visit: Payer: Self-pay

## 2024-02-19 DIAGNOSIS — E7800 Pure hypercholesterolemia, unspecified: Secondary | ICD-10-CM | POA: Diagnosis not present

## 2024-02-19 DIAGNOSIS — Z0000 Encounter for general adult medical examination without abnormal findings: Secondary | ICD-10-CM | POA: Diagnosis not present

## 2024-02-19 DIAGNOSIS — Z862 Personal history of diseases of the blood and blood-forming organs and certain disorders involving the immune mechanism: Secondary | ICD-10-CM | POA: Diagnosis not present

## 2024-02-19 DIAGNOSIS — E1169 Type 2 diabetes mellitus with other specified complication: Secondary | ICD-10-CM | POA: Diagnosis not present

## 2024-02-19 DIAGNOSIS — E785 Hyperlipidemia, unspecified: Secondary | ICD-10-CM | POA: Diagnosis not present

## 2024-03-17 ENCOUNTER — Telehealth: Admitting: Physician Assistant

## 2024-03-17 DIAGNOSIS — R3989 Other symptoms and signs involving the genitourinary system: Secondary | ICD-10-CM | POA: Diagnosis not present

## 2024-03-18 ENCOUNTER — Other Ambulatory Visit: Payer: Self-pay

## 2024-03-18 MED ORDER — NITROFURANTOIN MONOHYD MACRO 100 MG PO CAPS
100.0000 mg | ORAL_CAPSULE | Freq: Two times a day (BID) | ORAL | 0 refills | Status: AC
  Filled 2024-03-18: qty 10, 5d supply, fill #0

## 2024-03-18 NOTE — Progress Notes (Signed)

## 2024-03-21 ENCOUNTER — Ambulatory Visit
Admission: EM | Admit: 2024-03-21 | Discharge: 2024-03-21 | Disposition: A | Discharge status: Home / Self Care | Attending: Physician Assistant | Admitting: Physician Assistant

## 2024-03-21 ENCOUNTER — Other Ambulatory Visit: Payer: Self-pay

## 2024-03-21 DIAGNOSIS — N898 Other specified noninflammatory disorders of vagina: Secondary | ICD-10-CM | POA: Diagnosis not present

## 2024-03-21 DIAGNOSIS — R300 Dysuria: Secondary | ICD-10-CM

## 2024-03-21 DIAGNOSIS — M545 Low back pain, unspecified: Secondary | ICD-10-CM | POA: Diagnosis not present

## 2024-03-21 LAB — URINALYSIS, W/ REFLEX TO CULTURE (INFECTION SUSPECTED)
Bilirubin Urine: NEGATIVE
Glucose, UA: NEGATIVE mg/dL
Ketones, ur: NEGATIVE mg/dL
Leukocytes,Ua: NEGATIVE
Nitrite: NEGATIVE
Specific Gravity, Urine: 1.025 (ref 1.005–1.030)
pH: 6 (ref 5.0–8.0)

## 2024-03-21 MED ORDER — CIPROFLOXACIN HCL 250 MG PO TABS: 250.0000 mg | ORAL_TABLET | Freq: Once | ORAL | Status: DC

## 2024-03-21 MED ORDER — CEPHALEXIN 500 MG PO CAPS
500.0000 mg | ORAL_CAPSULE | Freq: Three times a day (TID) | ORAL | 0 refills | Status: AC
Start: 2024-03-21 — End: 2024-03-29
  Filled 2024-03-21: qty 21, 7d supply, fill #0

## 2024-03-21 MED ORDER — CEFTRIAXONE SODIUM 1 G IJ SOLR
1.0000 g | Freq: Once | INTRAMUSCULAR | Status: AC
  Administered 2024-03-21: 1 g via INTRAMUSCULAR

## 2024-03-21 NOTE — ED Triage Notes (Signed)
 painful urination 4 days   Swab for yeast and BV

## 2024-03-21 NOTE — ED Provider Notes (Signed)
 [facility]    CSN: [id number] Arrival date & time: 03/21/24  1550      History   Chief Complaint Chief Complaint  Patient presents with   Dysuria    HPI Erika Salazar is a 43 y.o. female presenting for lower abdominal pain and dysuria x 4 days. Reports fatigue and a little bit of lower back pain today. Denies hematuria. No fever. Reports mild vaginal discharge without odor.   Had a e-visit 4 day ago and was started on Macrobid which helped urinary symptoms mildly, but she reports still not feeling well.   Unsure of last menstral period. She reports continuous use of OCPs.   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   Overweight (BMI 25.0-29.9) 05/25/2021   Dyslipidemia 05/25/2021   B12 deficiency 11/06/2017   History of normocytic normochromic anemia 11/06/2017   Vaccine counseling 11/06/2017   Vitamin D insufficiency 11/06/2017   History of cervical dysplasia 03/24/2016   Borderline diabetes mellitus 11/05/2015    Past Surgical History:  Procedure Laterality Date   CERVICAL BIOPSY  W/ LOOP ELECTRODE EXCISION  2014   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 Authorizing Provider  Calcium Carbonate-Vitamin D 600-400 MG-UNIT tablet Take by mouth.   Yes [provider]  cephALEXin (KEFLEX) 500 MG capsule Take 1 capsule (500 mg total) by mouth 3  (three) times daily for 7 days. 03/21/24 03/28/24 Yes Shirlee Latch, PA-C  metFORMIN (GLUCOPHAGE-XR) 500 MG 24 hr tablet Take 2 tablets (1,000 mg total) by mouth once daily with dinner. 10/31/23  Yes   Norethindrone Acetate-Ethinyl Estrad-FE (HAILEY 24 FE) 1-20 MG-MCG(24) tablet Take 1 tablet by mouth daily. Take continuously, skip placebo pills 07/12/23  Yes Hildred Laser, MD  vitamin B-12 (CYANOCOBALAMIN) 100 MCG tablet Take 100 mcg by mouth daily.   Yes [provider]  Blood Glucose Monitoring Suppl (FREESTYLE FREEDOM LITE) w/Device KIT use as directed 01/31/23     glucose blood (PRECISION QID TEST) test strip Check fasting blood sugar once daily. 01/31/23     Lancets (FREESTYLE) lancets Check blood sugar fasting once daily. 01/31/23     nitrofurantoin, macrocrystal-monohydrate, (MACROBID) 100 MG capsule Take 1 capsule (100 mg total) by mouth 2 (two) times daily. 03/18/24   Margaretann Loveless, PA-C  ondansetron (ZOFRAN-ODT) 4 MG disintegrating tablet Take 1 tablet (4 mg total) by mouth every 8 (eight) hours as needed for Nausea or Vomiting 01/31/23     lisinopril (ZESTRIL) 5 MG tablet Take 1 tablet (5 mg  total) by mouth daily. 01/31/23 05/02/23      Family History Family History  Problem Relation Age of Onset   Diabetes Mother    Hypertension Mother    Diabetes Father    Hypertension Father    Cancer Neg Hx    Heart disease Neg Hx    Kidney cancer Neg Hx    Kidney disease Neg Hx    Sickle cell trait Neg Hx    Prostate cancer Neg Hx    Tuberculosis Neg Hx    Breast cancer Neg Hx     Social History Social History   Tobacco Use   Smoking status: Never   Smokeless tobacco: Never  Vaping Use   Vaping status: Never Used  Substance Use Topics   Alcohol use: Yes    Comment: Occasional   Drug use: No     Allergies   Patient has no known allergies.   Review of Systems Review of Systems  Constitutional:  Positive for fatigue. Negative for chills and fever.   Gastrointestinal:  Positive for abdominal pain. Negative for diarrhea, nausea and vomiting.  Genitourinary:  Positive for dysuria, frequency and vaginal discharge. Negative for decreased urine volume, flank pain, hematuria, pelvic pain, urgency, vaginal bleeding and vaginal pain.  Musculoskeletal:  Negative for back pain.  Skin:  Negative for rash.     Physical Exam Triage Vital Signs ED Triage Vitals [03/21/24 1559]  Encounter Vitals Group     BP 116/78     Systolic BP Percentile      Diastolic BP Percentile      Pulse Rate 74     Resp 16     Temp 97.6 F (36.4 C)     Temp Source Oral     SpO2 99 %     Weight      Height      Head Circumference      Peak Flow      Pain Score      Pain Loc      Pain Education      Exclude from Growth Chart    No data found.  Updated Vital Signs BP 116/78 (BP Location: Left Arm)   Pulse 74   Temp 97.6 F (36.4 C) (Oral)   Resp 16   SpO2 99%       Physical Exam Vitals and nursing note reviewed.  Constitutional:      General: She is not in acute distress.    Appearance: Normal appearance. She is not ill-appearing or toxic-appearing.  HENT:     Head: Normocephalic and atraumatic.  Eyes:     General: No scleral icterus.       Right eye: No discharge.        Left eye: No discharge.     Conjunctiva/sclera: Conjunctivae normal.  Cardiovascular:     Rate and Rhythm: Normal rate and regular rhythm.     Heart sounds: Normal heart sounds.  Pulmonary:     Effort: Pulmonary effort is normal. No respiratory distress.     Breath sounds: Normal breath sounds.  Abdominal:     Palpations: Abdomen is soft.     Tenderness: There is abdominal tenderness (suprapubic). There is no right CVA tenderness or left CVA tenderness.  Musculoskeletal:     Cervical back: Neck supple.  Skin:    General: Skin is dry.  Neurological:     General: No focal deficit present.     Mental Status: She is alert. Mental status  is at baseline.     Motor: No  weakness.     Gait: Gait normal.  Psychiatric:        Mood and Affect: Mood normal.        Behavior: Behavior normal.      UC Treatments / Results  Labs (all labs ordered are listed, but only abnormal results are displayed) Labs Reviewed  URINALYSIS, W/ REFLEX TO CULTURE (INFECTION SUSPECTED) - Abnormal; Notable for the following components:      Result Value   Hgb urine dipstick MODERATE (*)    Protein, ur TRACE (*)    Bacteria, UA FEW (*)    All other components within normal limits  URINE CULTURE  CERVICOVAGINAL ANCILLARY ONLY    EKG   Radiology No results found.  Procedures Procedures (including critical care time)  Medications Ordered in UC Medications  cefTRIAXone (ROCEPHIN) injection 1 g (1 g Intramuscular Given 03/21/24 1654)    Initial Impression / Assessment and Plan / UC Course  I have reviewed the triage vital signs and the nursing notes.  Pertinent labs & imaging results that were available during my care of the patient were reviewed by me and considered in my medical decision making (see chart for details).   43 year old female presents with 4-day history of lower abdominal cramping pain, dysuria and incomplete bladder emptying.  Had an e-visit 4 days ago and started on Macrobid which helped minimally with urinary symptoms.  Denies fever or flank pain.  Has had some lower back pain.  History of UTIs.  Vital stable and normal.  Overall well-appearing.  No acute distress.  Has tenderness of the lower abdomen, mostly suprapubic without guarding or rebound.  No CVA tenderness.  Chest clear.  Heart regular rate and rhythm.  UA today shows moderate hemoglobin, trace protein and bacteria.  Will send urine for culture.  Possible UTI.  I do not have a preantibiotic urinalysis to compare to.  Vaginal swab obtained for BV and yeast.  Will treat for vaginal infection if positive results.  Suspect continued urinary tract infection despite no nitrites or leukocytes on  urinalysis.  Currently on antibiotics.  We discussed stronger antibiotics for continued symptoms.  Patient unable to pick up prescriptions until tomorrow.  She was given 1 g IM Rocephin in clinic and I sent Keflex to pharmacy.  Encouraged increasing rest and fluids.  Thoroughly reviewed return and ER precautions.  Will amend treatment based on urine culture if necessary.  Final Clinical Impressions(s) / UC Diagnoses   Final diagnoses:  Dysuria  Acute low back pain, unspecified back pain laterality, unspecified whether sciatica present  Vaginal discharge     Discharge Instructions      UTI: Based on either symptoms or urinalysis, you may have a urinary tract infection. We will send the urine for culture and call with results in a few days. Begin antibiotics at this time. Your symptoms should be much improved over the next 2-3 days. Increase rest and fluid intake. If for some reason symptoms are worsening or not improving after a couple of days or the urine culture determines the antibiotics you are taking will not treat the infection, the antibiotics may be changed. Return or go to ER for fever, back pain, worsening urinary pain, discharge, increased blood in urine. May take Tylenol or Motrin OTC for pain relief or consider AZO if no contraindications      ED Prescriptions     Medication Sig Dispense Auth. Provider  cephALEXin (KEFLEX) 500 MG capsule Take 1 capsule (500 mg total) by mouth 3 (three) times daily for 7 days. 21 capsule Shirlee Latch, PA-C      PDMP not reviewed this encounter.   Shirlee Latch, PA-C 03/21/24 1705

## 2024-03-21 NOTE — Discharge Instructions (Addendum)

## 2024-03-22 ENCOUNTER — Telehealth (HOSPITAL_COMMUNITY): Payer: Self-pay

## 2024-03-22 ENCOUNTER — Other Ambulatory Visit: Payer: Self-pay

## 2024-03-22 LAB — URINE CULTURE: Culture: NO GROWTH

## 2024-03-22 LAB — CERVICOVAGINAL ANCILLARY ONLY
Bacterial Vaginitis (gardnerella): NEGATIVE
Candida Glabrata: NEGATIVE
Candida Vaginitis: POSITIVE — AB
Comment: NEGATIVE
Comment: NEGATIVE
Comment: NEGATIVE

## 2024-03-22 MED ORDER — FLUCONAZOLE 150 MG PO TABS
150.0000 mg | ORAL_TABLET | Freq: Once | ORAL | 0 refills | Status: AC
Start: 2024-03-22 — End: 2024-03-28
  Filled 2024-03-22: qty 2, 3d supply, fill #0

## 2024-03-22 NOTE — Telephone Encounter (Signed)
 Per protocol, pt requires tx with Diflucan. Reviewed with patient, verified pharmacy, prescription sent.

## 2024-04-15 ENCOUNTER — Other Ambulatory Visit: Payer: Self-pay

## 2024-04-15 MED ORDER — MOXIFLOXACIN HCL 0.5 % OP SOLN
1.0000 [drp] | Freq: Three times a day (TID) | OPHTHALMIC | 0 refills | Status: AC
  Filled 2024-04-15: qty 3, 20d supply, fill #0

## 2024-04-29 ENCOUNTER — Other Ambulatory Visit: Payer: Self-pay

## 2024-05-16 ENCOUNTER — Other Ambulatory Visit: Payer: Self-pay

## 2024-05-24 ENCOUNTER — Other Ambulatory Visit: Payer: Self-pay

## 2024-05-28 ENCOUNTER — Other Ambulatory Visit: Payer: Self-pay

## 2024-05-28 MED ORDER — ONETOUCH ULTRA 2 W/DEVICE KIT
PACK | Freq: Every day | 0 refills | Status: AC
  Filled 2024-05-28: qty 1, 30d supply, fill #0

## 2024-05-28 MED ORDER — PRECISION QID TEST VI STRP
ORAL_STRIP | 1 refills | Status: AC
  Filled 2024-05-28: qty 100, 30d supply, fill #0

## 2024-05-29 ENCOUNTER — Other Ambulatory Visit: Payer: Self-pay

## 2024-06-04 ENCOUNTER — Other Ambulatory Visit: Payer: Self-pay

## 2024-06-04 MED ORDER — METFORMIN HCL ER 500 MG PO TB24
1000.0000 mg | ORAL_TABLET | Freq: Every day | ORAL | 1 refills | Status: AC
  Filled 2024-06-04: qty 180, 90d supply, fill #0
  Filled 2024-10-01: qty 180, 90d supply, fill #1

## 2024-08-06 ENCOUNTER — Other Ambulatory Visit: Payer: Self-pay | Admitting: Family Medicine

## 2024-08-06 DIAGNOSIS — Z1231 Encounter for screening mammogram for malignant neoplasm of breast: Secondary | ICD-10-CM

## 2024-08-16 DIAGNOSIS — E1169 Type 2 diabetes mellitus with other specified complication: Secondary | ICD-10-CM | POA: Diagnosis not present

## 2024-08-16 DIAGNOSIS — Z862 Personal history of diseases of the blood and blood-forming organs and certain disorders involving the immune mechanism: Secondary | ICD-10-CM | POA: Diagnosis not present

## 2024-08-16 DIAGNOSIS — E7800 Pure hypercholesterolemia, unspecified: Secondary | ICD-10-CM | POA: Diagnosis not present

## 2024-08-16 DIAGNOSIS — E785 Hyperlipidemia, unspecified: Secondary | ICD-10-CM | POA: Diagnosis not present

## 2024-08-16 DIAGNOSIS — Z09 Encounter for follow-up examination after completed treatment for conditions other than malignant neoplasm: Secondary | ICD-10-CM | POA: Diagnosis not present

## 2024-08-23 ENCOUNTER — Other Ambulatory Visit: Payer: Self-pay

## 2024-08-23 DIAGNOSIS — E7800 Pure hypercholesterolemia, unspecified: Secondary | ICD-10-CM | POA: Diagnosis not present

## 2024-08-23 DIAGNOSIS — Z30019 Encounter for initial prescription of contraceptives, unspecified: Secondary | ICD-10-CM | POA: Diagnosis not present

## 2024-08-23 DIAGNOSIS — E1169 Type 2 diabetes mellitus with other specified complication: Secondary | ICD-10-CM | POA: Diagnosis not present

## 2024-08-23 DIAGNOSIS — E785 Hyperlipidemia, unspecified: Secondary | ICD-10-CM | POA: Diagnosis not present

## 2024-08-23 DIAGNOSIS — E1129 Type 2 diabetes mellitus with other diabetic kidney complication: Secondary | ICD-10-CM | POA: Diagnosis not present

## 2024-08-23 DIAGNOSIS — R809 Proteinuria, unspecified: Secondary | ICD-10-CM | POA: Diagnosis not present

## 2024-08-23 MED ORDER — FREESTYLE FREEDOM LITE W/DEVICE KIT
PACK | 0 refills | Status: AC
  Filled 2024-08-23: qty 1, 30d supply, fill #0
  Filled 2024-08-26: qty 1, 1d supply, fill #0

## 2024-08-23 MED ORDER — PRECISION QID TEST VI STRP
ORAL_STRIP | 1 refills | Status: AC
  Filled 2024-08-23: qty 100, 90d supply, fill #0
  Filled 2024-08-26: qty 100, 45d supply, fill #0
  Filled 2024-08-26: qty 100, 90d supply, fill #0

## 2024-08-23 MED ORDER — LISINOPRIL 2.5 MG PO TABS
2.5000 mg | ORAL_TABLET | Freq: Every day | ORAL | 1 refills | Status: AC
  Filled 2024-08-23: qty 90, 90d supply, fill #0

## 2024-08-23 MED ORDER — FREESTYLE LANCETS MISC
1.0000 | Freq: Every day | 1 refills | Status: AC
  Filled 2024-08-23 – 2024-08-26 (×2): qty 100, 90d supply, fill #0

## 2024-08-23 MED ORDER — LARIN 24 FE 1-20 MG-MCG(24) PO TABS
1.0000 | ORAL_TABLET | Freq: Every day | ORAL | 1 refills | Status: AC
  Filled 2024-08-23: qty 84, 84d supply, fill #0
  Filled 2024-12-23: qty 84, 84d supply, fill #1

## 2024-08-26 ENCOUNTER — Other Ambulatory Visit: Payer: Self-pay

## 2024-08-29 ENCOUNTER — Ambulatory Visit
Admission: RE | Admit: 2024-08-29 | Discharge: 2024-08-29 | Disposition: A | Discharge status: Home / Self Care | Source: Ambulatory Visit | Attending: Family Medicine | Admitting: Family Medicine

## 2024-08-29 DIAGNOSIS — H5213 Myopia, bilateral: Secondary | ICD-10-CM | POA: Diagnosis not present

## 2024-08-29 DIAGNOSIS — Z1231 Encounter for screening mammogram for malignant neoplasm of breast: Secondary | ICD-10-CM | POA: Diagnosis not present

## 2024-09-18 ENCOUNTER — Other Ambulatory Visit: Payer: Self-pay

## 2024-11-22 DIAGNOSIS — E1169 Type 2 diabetes mellitus with other specified complication: Secondary | ICD-10-CM | POA: Diagnosis not present

## 2024-11-22 DIAGNOSIS — E7800 Pure hypercholesterolemia, unspecified: Secondary | ICD-10-CM | POA: Diagnosis not present

## 2024-11-22 DIAGNOSIS — E785 Hyperlipidemia, unspecified: Secondary | ICD-10-CM | POA: Diagnosis not present

## 2024-11-29 DIAGNOSIS — E559 Vitamin D deficiency, unspecified: Secondary | ICD-10-CM | POA: Diagnosis not present

## 2024-11-29 DIAGNOSIS — E785 Hyperlipidemia, unspecified: Secondary | ICD-10-CM | POA: Diagnosis not present

## 2024-11-29 DIAGNOSIS — E1169 Type 2 diabetes mellitus with other specified complication: Secondary | ICD-10-CM | POA: Diagnosis not present

## 2024-11-29 DIAGNOSIS — E538 Deficiency of other specified B group vitamins: Secondary | ICD-10-CM | POA: Diagnosis not present

## 2024-11-29 DIAGNOSIS — Z1211 Encounter for screening for malignant neoplasm of colon: Secondary | ICD-10-CM | POA: Diagnosis not present

## 2024-11-29 DIAGNOSIS — E7800 Pure hypercholesterolemia, unspecified: Secondary | ICD-10-CM | POA: Diagnosis not present

## 2024-12-10 ENCOUNTER — Other Ambulatory Visit: Payer: Self-pay

## 2024-12-10 MED ORDER — NA SULFATE-K SULFATE-MG SULF 17.5-3.13-1.6 GM/177ML PO SOLN
ORAL | 0 refills | Status: AC
  Filled 2024-12-10: qty 354, 1d supply, fill #0

## 2024-12-16 ENCOUNTER — Other Ambulatory Visit: Payer: Self-pay

## 2025-01-16 ENCOUNTER — Encounter: Payer: Self-pay | Admitting: Gastroenterology

## 2025-01-16 ENCOUNTER — Other Ambulatory Visit: Payer: Self-pay

## 2025-01-16 ENCOUNTER — Ambulatory Visit
Admission: RE | Admit: 2025-01-16 | Discharge: 2025-01-16 | Disposition: A | Discharge status: Home / Self Care | Attending: Gastroenterology | Admitting: Gastroenterology

## 2025-01-16 ENCOUNTER — Ambulatory Visit: Admitting: Anesthesiology

## 2025-01-16 ENCOUNTER — Encounter
Admission: RE | Disposition: A | Discharge status: Home / Self Care | Payer: Self-pay | Source: Home / Self Care | Attending: Gastroenterology

## 2025-01-16 DIAGNOSIS — E119 Type 2 diabetes mellitus without complications: Secondary | ICD-10-CM | POA: Diagnosis not present

## 2025-01-16 DIAGNOSIS — K644 Residual hemorrhoidal skin tags: Secondary | ICD-10-CM | POA: Diagnosis not present

## 2025-01-16 DIAGNOSIS — K635 Polyp of colon: Secondary | ICD-10-CM | POA: Diagnosis not present

## 2025-01-16 DIAGNOSIS — Z1211 Encounter for screening for malignant neoplasm of colon: Secondary | ICD-10-CM | POA: Diagnosis present

## 2025-01-16 DIAGNOSIS — Z7984 Long term (current) use of oral hypoglycemic drugs: Secondary | ICD-10-CM | POA: Diagnosis not present

## 2025-01-16 LAB — POCT PREGNANCY, URINE: Preg Test, Ur: NEGATIVE

## 2025-01-16 LAB — GLUCOSE, CAPILLARY: Glucose-Capillary: 153 mg/dL — ABNORMAL HIGH (ref 70–99)

## 2025-01-16 MED ORDER — LIDOCAINE HCL (CARDIAC) PF 100 MG/5ML IV SOSY
PREFILLED_SYRINGE | INTRAVENOUS | Status: DC | PRN
  Administered 2025-01-16: 40 mg via INTRAVENOUS

## 2025-01-16 MED ORDER — LIDOCAINE HCL (PF) 2 % IJ SOLN
INTRAMUSCULAR | Status: AC
  Filled 2025-01-16: qty 5

## 2025-01-16 MED ORDER — DEXMEDETOMIDINE HCL IN NACL 80 MCG/20ML IV SOLN
INTRAVENOUS | Status: AC
  Filled 2025-01-16: qty 20

## 2025-01-16 MED ORDER — PROPOFOL 500 MG/50ML IV EMUL
INTRAVENOUS | Status: DC | PRN
  Administered 2025-01-16: 165 ug/kg/min via INTRAVENOUS

## 2025-01-16 MED ORDER — DEXMEDETOMIDINE HCL IN NACL 80 MCG/20ML IV SOLN
INTRAVENOUS | Status: DC | PRN
  Administered 2025-01-16: 4 ug via INTRAVENOUS
  Administered 2025-01-16: 2 ug via INTRAVENOUS

## 2025-01-16 MED ORDER — PROPOFOL 10 MG/ML IV BOLUS
INTRAVENOUS | Status: DC | PRN
  Administered 2025-01-16: 30 mg via INTRAVENOUS
  Administered 2025-01-16: 90 mg via INTRAVENOUS
  Administered 2025-01-16: 30 mg via INTRAVENOUS

## 2025-01-16 MED ORDER — SODIUM CHLORIDE 0.9 % IV SOLN: INTRAVENOUS | Status: DC

## 2025-01-16 NOTE — Transfer of Care (Signed)
 Immediate Anesthesia Transfer of Care Note  Patient: Erika Salazar  Procedure(s) Performed: COLONOSCOPY POLYPECTOMY, INTESTINE  Patient Location: PACU  Anesthesia Type:General  Level of Consciousness: drowsy  Airway & Oxygen Therapy: Patient Spontanous Breathing  Post-op Assessment: Report given to RN and Post -op Vital signs reviewed and stable  Post vital signs: Reviewed and stable  Last Vitals:  Vitals Value Taken Time  BP 90/60 01/16/25 08:13  Temp 35.8 C 01/16/25 08:13  Pulse 69 01/16/25 08:13  Resp 22 01/16/25 08:13  SpO2 96 % 01/16/25 08:13    Last Pain:  Vitals:   01/16/25 0813  TempSrc: Temporal  PainSc: Asleep         Complications: No notable events documented.

## 2025-01-16 NOTE — Anesthesia Preprocedure Evaluation (Signed)
"                                    Anesthesia Evaluation  Patient identified by MRN, date of birth, ID band Patient awake    Reviewed: Allergy & Precautions, NPO status , Patient's Chart, lab work & pertinent test results  Airway Mallampati: II  TM Distance: >3 FB Neck ROM: Full    Dental  (+) Teeth Intact   Pulmonary neg pulmonary ROS   Pulmonary exam normal        Cardiovascular Exercise Tolerance: Good negative cardio ROS Normal cardiovascular exam Rhythm:Regular Rate:Normal     Neuro/Psych negative neurological ROS  negative psych ROS   GI/Hepatic negative GI ROS, Neg liver ROS,,,  Endo/Other  negative endocrine ROSdiabetes, Well Controlled, Type 2, Oral Hypoglycemic Agents    Renal/GU negative Renal ROS  negative genitourinary   Musculoskeletal negative musculoskeletal ROS (+)    Abdominal Normal abdominal exam  (+)   Peds negative pediatric ROS (+)  Hematology negative hematology ROS (+)   Anesthesia Other Findings Past Medical History: No date: Amenorrhea No date: ASCUS with positive high risk HPV No date: BV (bacterial vaginosis) No date: Diabetes mellitus without complication (HCC) No date: Elevated glucose No date: Severe dysplasia of cervix No date: Vaginal cyst  Past Surgical History: 2014: CERVICAL BIOPSY  W/ LOOP ELECTRODE EXCISION     Comment:  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- POST LEEP ECC- 05/2014              NEG     Reproductive/Obstetrics negative OB ROS                              Anesthesia Physical Anesthesia Plan  ASA: 2  Anesthesia Plan: General   Post-op Pain Management:    Induction: Intravenous  PONV Risk Score and Plan: Propofol  infusion and TIVA  Airway Management Planned: Natural Airway and Nasal Cannula  Additional Equipment:   Intra-op Plan:   Post-operative Plan:   Informed Consent: I have reviewed the patients History and Physical, chart, labs and discussed  the procedure including the risks, benefits and alternatives for the proposed anesthesia with the patient or authorized representative who has indicated his/her understanding and acceptance.     Dental Advisory Given  Plan Discussed with: CRNA  Anesthesia Plan Comments:         Anesthesia Quick Evaluation  "

## 2025-01-16 NOTE — H&P (Signed)
 "   Erika JONELLE Brooklyn, MD [facility], [address]  [place], [place] [address]  Main: [phone] Fax:  [phone] Pager: [phone]   Primary Care Physician:  Alla Amis, MD Primary Gastroenterologist:  Dr. Corinn JONELLE Salazar  Pre-Procedure History & Physical: HPI:  Erika Salazar is a 44 y.o. female is here for an colonoscopy.   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 Authorizing Provider  Blood Glucose Monitoring Suppl (FREESTYLE FREEDOM LITE) w/Device KIT use as directed 01/31/23     Blood Glucose Monitoring Suppl (FREESTYLE FREEDOM LITE) w/Device KIT Use as directed 08/23/24     Blood Glucose Monitoring Suppl (ONE TOUCH ULTRA 2) w/Device KIT Check fasting blood sugar once daily. 05/28/24     Calcium Carbonate-Vitamin D 600-400 MG-UNIT tablet Take by mouth.    [provider]  glucose blood (PRECISION QID TEST) test strip Check fasting blood sugar once daily. 01/31/23     glucose blood (PRECISION QID TEST) test strip Check blood sugar fasting once daily. ONE TOUCH Dx. E11.69 05/28/24     glucose blood (PRECISION QID TEST) test strip Check blood sugar fasting once daily 08/23/24     Lancets (FREESTYLE) lancets Check blood sugar fasting once daily. 01/31/23     Lancets (FREESTYLE) lancets Check blood sugar fasting once daily 08/23/24     lisinopril  (ZESTRIL ) 2.5 MG tablet Take 1 tablet (2.5 mg total) by mouth daily. 08/23/24     metFORMIN  (GLUCOPHAGE -XR) 500 MG 24 hr tablet Take 2 tablets (1,000 mg total) by  mouth once daily with dinner. 10/31/23     metFORMIN  (GLUCOPHAGE -XR) 500 MG 24 hr tablet Take 2 tablets (1,000 mg total) by mouth daily. 06/04/24     Na Sulfate-K Sulfate-Mg Sulfate concentrate (SUPREP) 17.5-3.13-1.6 GM/177ML SOLN Take 1 Bottle by mouth as directed One kit contains 2 bottles.  Take both bottles at the times instructed by your provider. 12/10/24     nitrofurantoin , macrocrystal-monohydrate, (MACROBID ) 100 MG capsule Take 1 capsule (100 mg total) by mouth 2 (two) times daily. 03/18/24   Burnette, Jennifer M, PA-C  Norethindrone  Acetate-Ethinyl Estrad-FE (HAILEY  24 FE) 1-20 MG-MCG(24) tablet Take 1 tablet by mouth daily. Take continuously, skip placebo pills 07/12/23   Connell Davies, MD  Norethindrone  Acetate-Ethinyl Estrad-FE (LARIN  24 FE) 1-20 MG-MCG(24) tablet Take 1 tablet by mouth once daily 08/23/24     ondansetron  (ZOFRAN -ODT) 4 MG disintegrating tablet Take 1 tablet (4 mg total) by mouth every 8 (eight) hours as needed for Nausea or Vomiting 01/31/23     vitamin B-12 (CYANOCOBALAMIN ) 100 MCG tablet Take 100 mcg by mouth daily.    [provider]    Allergies as of 12/10/2024   (No Known Allergies)    Family History  Problem Relation Age of Onset   Diabetes Mother    Hypertension Mother    Diabetes Father    Hypertension Father    Cancer Neg Hx    Heart disease Neg Hx  Kidney cancer Neg Hx    Kidney disease Neg Hx    Sickle cell trait Neg Hx    Prostate cancer Neg Hx    Tuberculosis Neg Hx    Breast cancer Neg Hx     Social History   Socioeconomic History   Marital status: Married    Spouse name: Not on file   Number of children: Not on file   Years of education: Not on file   Highest education level: Not on file  Occupational History   Not on file  Tobacco Use   Smoking status: Never   Smokeless tobacco: Never  Vaping Use   Vaping status: Never Used  Substance and Sexual Activity   Alcohol use: Yes    Comment: Occasional   Drug use: No    Sexual activity: Yes    Birth control/protection: None, Pill  Other Topics Concern   Not on file  Social History Narrative   Not on file   Social Drivers of Health   Tobacco Use: Low Risk  (12/10/2024)   Received from [facility]   Patient History    Smoking Tobacco Use: Never    Smokeless Tobacco Use: Never    Passive Exposure: Never  Financial Resource Strain: Low Risk  (02/19/2024)   Received from [facility]   Overall Financial Resource Strain (CARDIA)    Difficulty of Paying Living Expenses: Not hard at all  Food Insecurity: No Food Insecurity (02/19/2024)   Received from [facility]   Epic    Within the past 12 months, you worried that your food would run out before you got the money to buy more.: Never true    Within the past 12 months, the food you bought just didn't last and you didn't have money to get more.: Never true  Transportation Needs: No Transportation Needs (02/19/2024)   Received from [facility] - Transportation    In the past 12 months, has lack of transportation kept you from medical appointments or from getting medications?: No    Lack of Transportation (Non-Medical): No  Physical Activity: Not on file  Stress: Not on file  Social Connections: Not on file  Intimate Partner Violence: Not on file  Depression (PHQ2-9): Low Risk (07/12/2023)   Depression (PHQ2-9)    PHQ-2 Score: 0  Alcohol Screen: Not on file  Housing: Low Risk  (02/19/2024)   Received from [facility]   Epic    In the last 12 months, was there a time when you were not able to pay the mortgage or rent on time?: No    In the past 12 months, how many times have you moved where you were living?: 0    At any time in the past 12 months, were you homeless or living in a shelter (including now)?: No  Utilities: Not At Risk (02/19/2024)   Received from [facility] Utilities     Threatened with loss of utilities: No  Health Literacy: Not on file    Review of Systems: See HPI, otherwise negative ROS  Physical Exam: BP 136/67   Pulse 69   Temp (!) 97.1 F (36.2 C) (Tympanic)   Resp 18   Ht 5' 6 (1.676 m)   Wt 80.4 kg   SpO2 100%   BMI 28.60 kg/m  General:   Alert,  pleasant and cooperative in NAD Head:  Normocephalic and  atraumatic. Neck:  Supple; no masses or thyromegaly. Lungs:  Clear throughout to auscultation.    Heart:  Regular rate and rhythm. Abdomen:  Soft, nontender and nondistended. Normal bowel sounds, without guarding, and without rebound.   Neurologic:  Alert and  oriented x4;  grossly normal neurologically.  Impression/Plan: Erika Salazar is here for an colonoscopy to be performed for colon cancer screening  Risks, benefits, limitations, and alternatives regarding  colonoscopy have been reviewed with the patient.  Questions have been answered.  All parties agreeable.   Erika Brooklyn, MD  01/16/2025, 7:52 AM "

## 2025-01-16 NOTE — Anesthesia Postprocedure Evaluation (Signed)
"   Anesthesia Post Note  Patient: Physiological Scientist  Procedure(s) Performed: COLONOSCOPY POLYPECTOMY, INTESTINE  Patient location during evaluation: PACU Anesthesia Type: General Level of consciousness: awake Pain management: pain level controlled Vital Signs Assessment: post-procedure vital signs reviewed and stable Respiratory status: spontaneous breathing Cardiovascular status: blood pressure returned to baseline Anesthetic complications: no   No notable events documented.   Last Vitals:  Vitals:   01/16/25 0823 01/16/25 0833  BP: 114/69 122/79  Pulse: 68 66  Resp: (!) 21 14  Temp:    SpO2: 100% 100%    Last Pain:  Vitals:   01/16/25 0819  TempSrc:   PainSc: 0-No pain                 VAN STAVEREN,Sritha Chauncey      "

## 2025-01-16 NOTE — Op Note (Signed)
 [facility] Gastroenterology Patient Name: Erika Salazar Procedure Date: 01/16/2025 7:56 AM MRN: [id number] Account #: [id number] Date of Birth: [birth year] Admit Type: Outpatient Age: 44 Room: [facility] ENDO ROOM 3 Gender: Female Note Status: Finalized Instrument Name: Colon Scope [id number] Procedure:             Colonoscopy Indications:           Screening for colorectal malignant neoplasm Providers:             Corinn Jess Brooklyn MD, MD Referring MD:          Alda Carpen (Referring MD) Medicines:             General Anesthesia Complications:         No immediate complications. Estimated blood loss: None. Procedure:             Pre-Anesthesia Assessment:                        - Prior to the procedure, a History and Physical was                         performed, and patient medications and allergies were                         reviewed. The patient is competent. The risks and                         benefits of the procedure and the sedation options and                         risks were discussed with the patient. All questions                         were answered and informed consent was obtained.                         Patient identification and proposed procedure were                         verified by the physician, the nurse, the                         anesthesiologist, the anesthetist and the technician                         in the pre-procedure area in the procedure room in the                         endoscopy suite. Mental Status Examination: alert and                         oriented. Airway Examination: normal oropharyngeal                         airway and neck mobility. Respiratory Examination:                         clear to auscultation. CV Examination: normal.  Prophylactic Antibiotics: The patient does not require                         prophylactic antibiotics. Prior Anticoagulants: The                          patient has taken no anticoagulant or antiplatelet                         agents. ASA Grade Assessment: II - A patient with mild                         systemic disease. After reviewing the risks and                         benefits, the patient was deemed in satisfactory                         condition to undergo the procedure. The anesthesia                         plan was to use general anesthesia. Immediately prior                         to administration of medications, the patient was                         re-assessed for adequacy to receive sedatives. The                         heart rate, respiratory rate, oxygen saturations,                         blood pressure, adequacy of pulmonary ventilation, and                         response to care were monitored throughout the                         procedure. The physical status of the patient was                         re-assessed after the procedure.                        After obtaining informed consent, the colonoscope was                         passed under direct vision. Throughout the procedure,                         the patient's blood pressure, pulse, and oxygen                         saturations were monitored continuously. The                         Colonoscope was introduced through the anus and  advanced to the the cecum, identified by appendiceal                         orifice and ileocecal valve. The colonoscopy was                         performed without difficulty. The patient tolerated                         the procedure well. The quality of the bowel                         preparation was evaluated using the BBPS [facility] Bowel                         Preparation Scale) with scores of: Right Colon = 3,                         Transverse Colon = 3 and Left Colon = 3 (entire mucosa                         seen well with no residual staining, small fragments                          of stool or opaque liquid). The total BBPS score                         equals 9. The ileocecal valve, appendiceal orifice,                         and rectum were photographed. Findings:      The perianal and digital rectal examinations were normal. Pertinent       negatives include normal sphincter tone and no palpable rectal lesions.      A 5 mm polyp was found in the proximal ascending colon. The polyp was       sessile. The polyp was removed with a cold snare. Resection and       retrieval were complete. Estimated blood loss: none.      Non-bleeding external hemorrhoids were found during retroflexion. The       hemorrhoids were small. Impression:            - One 5 mm polyp in the proximal ascending colon,                         removed with a cold snare. Resected and retrieved.                        - Non-bleeding external hemorrhoids. Recommendation:        - Discharge patient to home (with escort).                        - Resume previous diet today.                        - Continue present medications.                        -  Await pathology results.                        - Repeat colonoscopy in 5 years for surveillance. Procedure Code(s):     --- Professional ---                        [id number], Colonoscopy, flexible; with removal of                         tumor(s), polyp(s), or other lesion(s) by snare                         technique Diagnosis Code(s):     --- Professional ---                        Z12.11, Encounter for screening for malignant neoplasm                         of colon                        D12.2, Benign neoplasm of ascending colon                        K64.4, Residual hemorrhoidal skin tags CPT copyright 2022 [employer]. All rights reserved. The codes documented in this report are preliminary and upon coder review may  be revised to meet current compliance requirements. Dr. Corinn Brooklyn Corinn Jess Brooklyn MD, MD 01/16/2025  8:13:36 AM This report has been signed electronically. Number of Addenda: 0 Note Initiated On: 01/16/2025 7:56 AM Scope Withdrawal Time: 0 hours 7 minutes 10 seconds  Total Procedure Duration: 0 hours 9 minutes 17 seconds  Estimated Blood Loss:  Estimated blood loss: none.      [facility]

## 2025-01-17 LAB — SURGICAL PATHOLOGY

## 2025-01-21 ENCOUNTER — Ambulatory Visit: Payer: Self-pay | Admitting: Gastroenterology

## 2025-02-25 ENCOUNTER — Ambulatory Visit: Admitting: Registered Nurse
